# Patient Record
Sex: Female | Born: 1960 | Race: White | Hispanic: No | Marital: Married | Smoking: Never smoker
Health system: Southern US, Community
[De-identification: ages and names within clinical notes are randomized; demographics above are authoritative.]

## PROBLEM LIST (undated history)

## (undated) DIAGNOSIS — F419 Anxiety disorder, unspecified: Secondary | ICD-10-CM

## (undated) DIAGNOSIS — I82409 Acute embolism and thrombosis of unspecified deep veins of unspecified lower extremity: Secondary | ICD-10-CM

## (undated) HISTORY — DX: Anxiety disorder, unspecified: F41.9

## (undated) HISTORY — PX: VAGINAL HYSTERECTOMY: SUR661

## (undated) HISTORY — DX: Acute embolism and thrombosis of unspecified deep veins of unspecified lower extremity: I82.409

---

## 2005-04-11 ENCOUNTER — Inpatient Hospital Stay: Payer: Self-pay | Admitting: Obstetrics and Gynecology

## 2007-05-19 ENCOUNTER — Ambulatory Visit: Payer: Self-pay | Admitting: Obstetrics and Gynecology

## 2007-05-19 ENCOUNTER — Other Ambulatory Visit: Payer: Self-pay

## 2009-10-25 ENCOUNTER — Ambulatory Visit: Payer: Self-pay | Admitting: Chiropractic Medicine

## 2011-01-15 IMAGING — CR DG FOOT COMPLETE 3+V*L*
1 series · 3 of 3 positions shown · non-contrast
Comparison: none

REASON FOR EXAM: Pain along lateral aspect of left foot
COMMENTS:

PROCEDURE:     DXR - DXR FOOT LT COMP W/OBLIQUES  - October 25, 2009  [DATE]
RESULT:     Images of the left foot demonstrate no definite fracture,
dislocation or radiopaque foreign body. If an occult fracture is suspected
then repeat images would be recommended in 7 to 10 days.

[Series 1: view not recorded · 0.17mm/px · 3 of 3 slices shown]
[im 1/3]
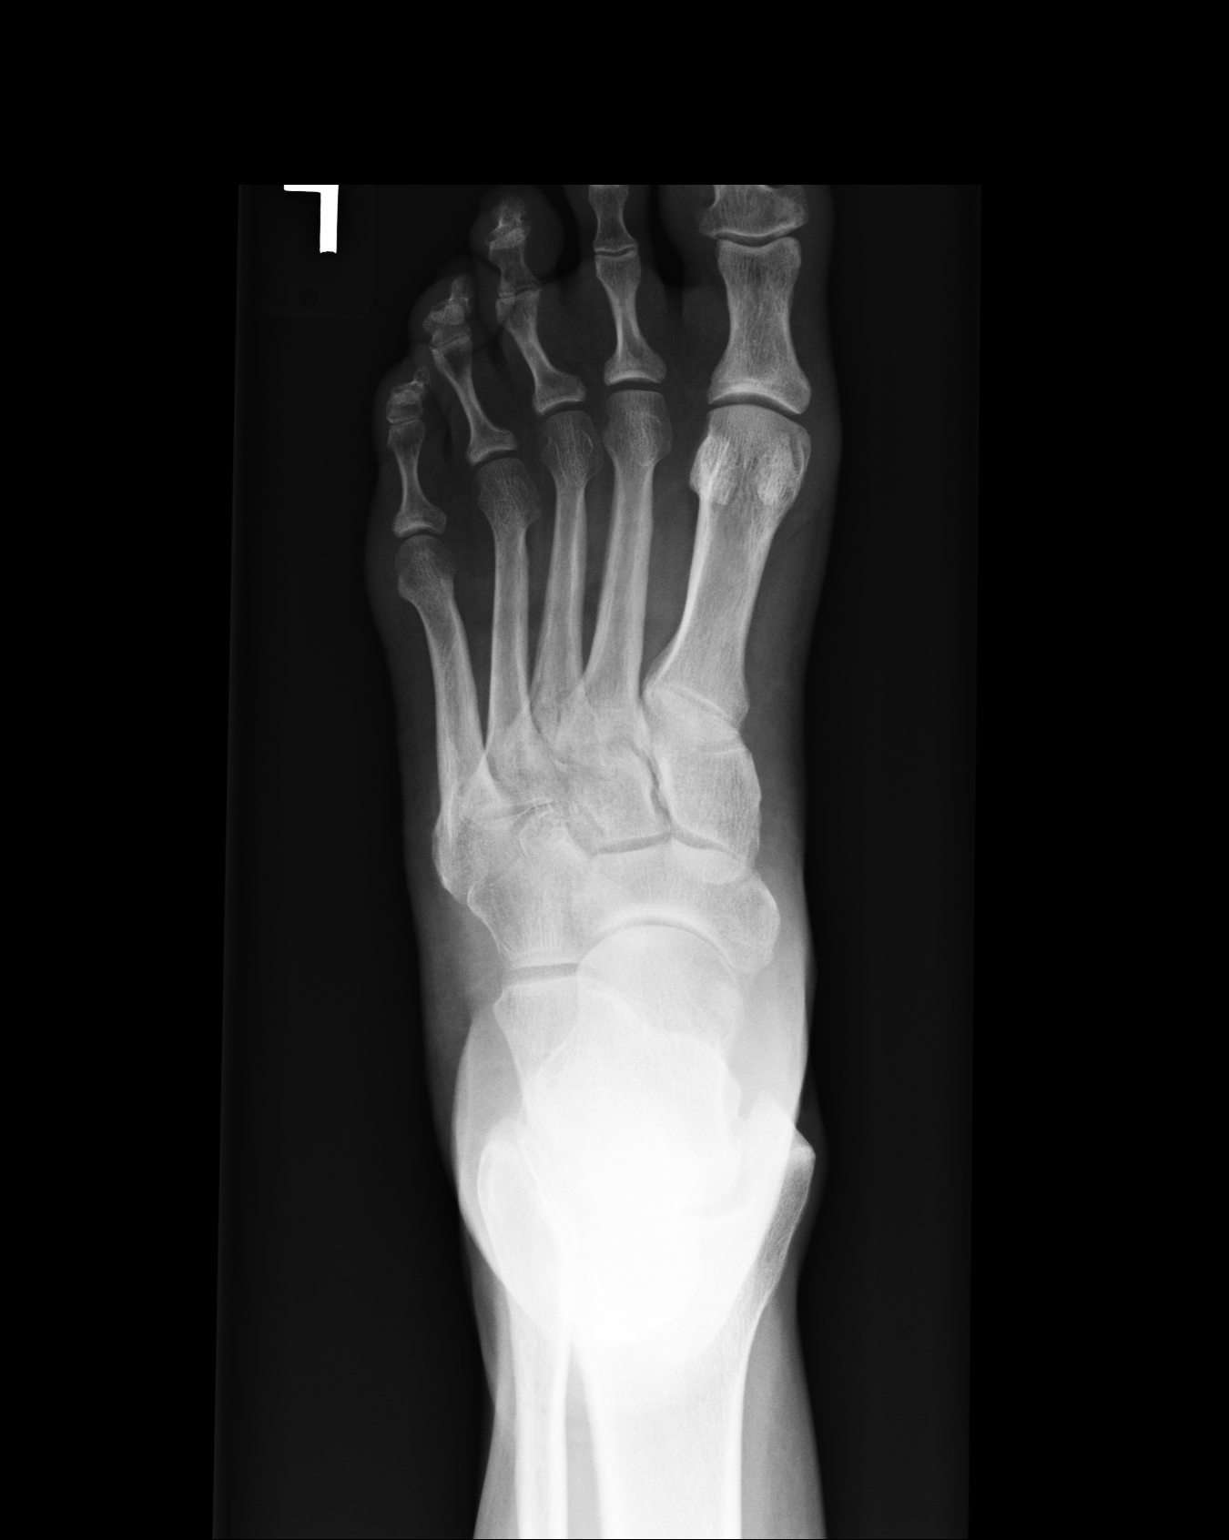
[im 2/3]
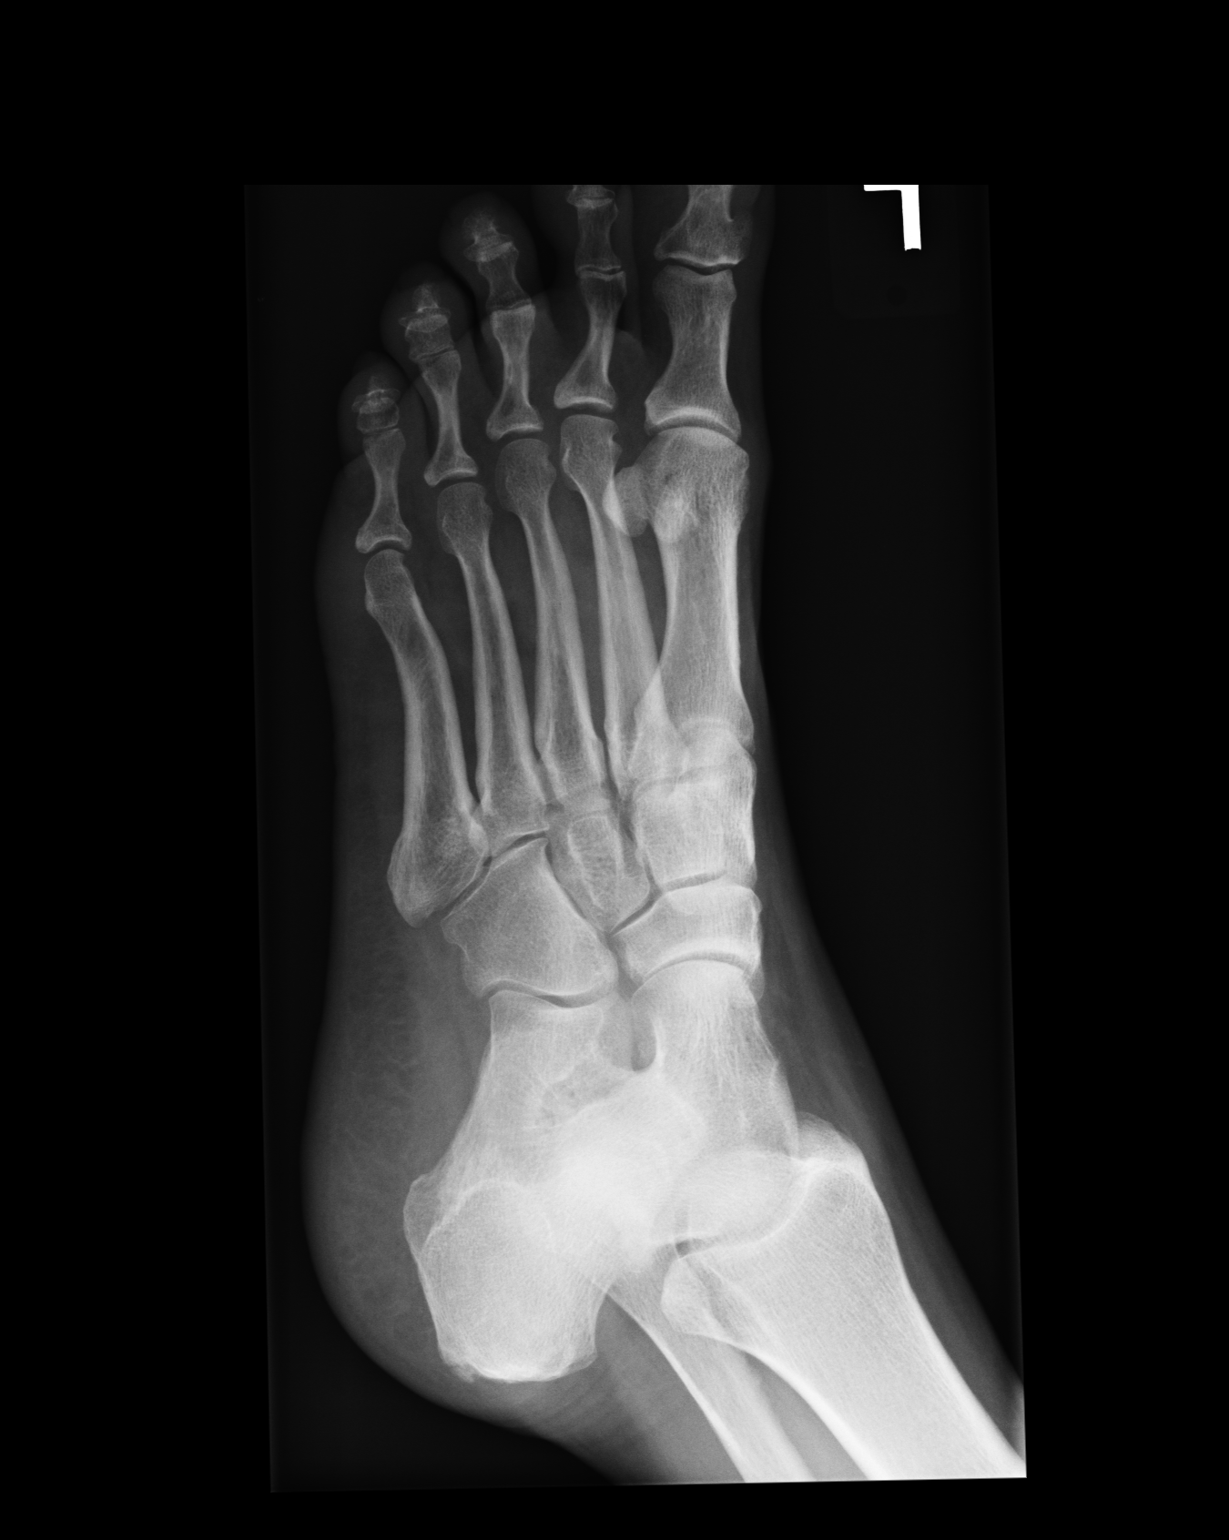
[im 3/3]
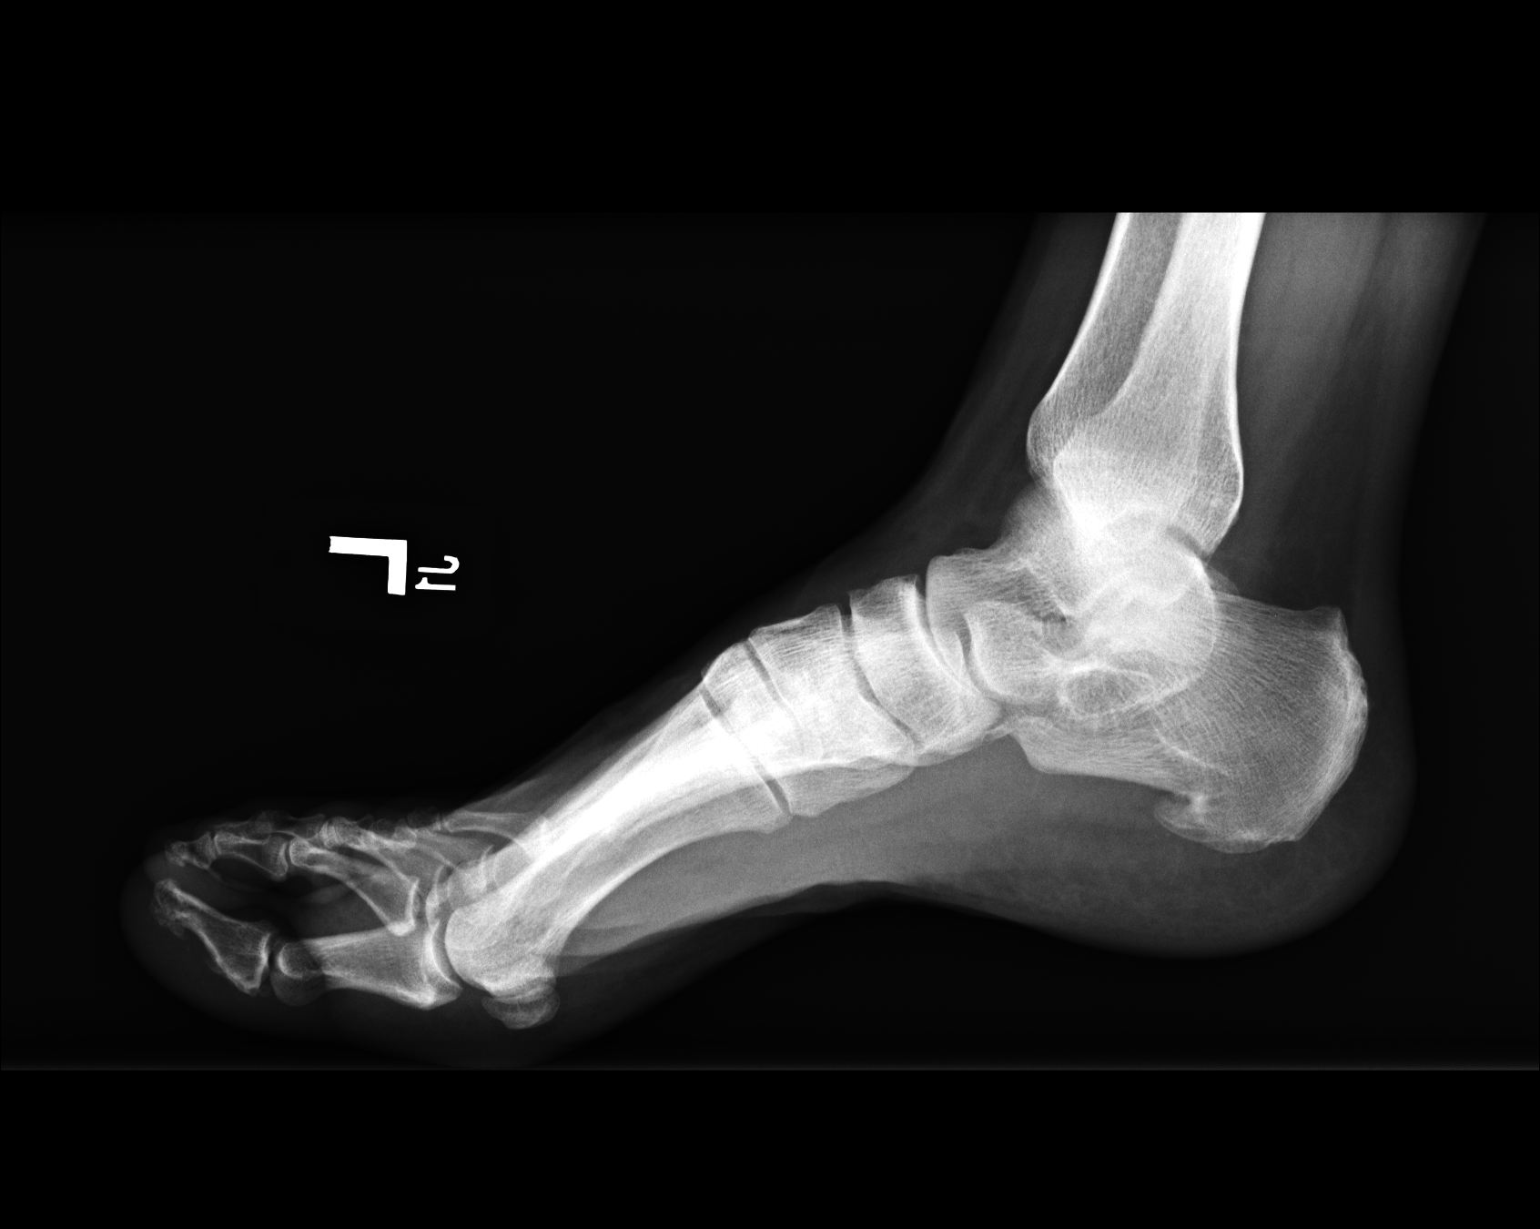

[3 of 3 positions shown; findings below may reference images not displayed]

IMPRESSION: Please see above.

## 2013-10-06 LAB — HM MAMMOGRAPHY: HM Mammogram: NORMAL

## 2015-02-27 LAB — TSH: TSH: 2.03 u[IU]/mL (ref <=5.90)

## 2015-02-27 LAB — HEPATIC FUNCTION PANEL
ALT: 27 U/L (ref 7–35)
AST: 25 U/L (ref 13–35)

## 2015-02-27 LAB — BASIC METABOLIC PANEL
BUN: 14 mg/dL (ref 4–21)
CREATININE: 0.8 mg/dL (ref <=1.1)
GLUCOSE: 93 mg/dL

## 2015-02-27 LAB — LIPID PANEL
Cholesterol: 163 mg/dL (ref 0–200)
HDL: 63 mg/dL (ref 35–70)
LDL Cholesterol: 87 mg/dL
Triglycerides: 64 mg/dL (ref 40–160)

## 2015-02-27 LAB — CBC AND DIFFERENTIAL: Hemoglobin: 13.9 g/dL (ref 12.0–16.0)

## 2015-02-28 DIAGNOSIS — F419 Anxiety disorder, unspecified: Secondary | ICD-10-CM | POA: Insufficient documentation

## 2015-02-28 DIAGNOSIS — K589 Irritable bowel syndrome without diarrhea: Secondary | ICD-10-CM | POA: Insufficient documentation

## 2015-02-28 DIAGNOSIS — Z0000 Encounter for general adult medical examination without abnormal findings: Secondary | ICD-10-CM | POA: Insufficient documentation

## 2015-11-19 ENCOUNTER — Ambulatory Visit (INDEPENDENT_AMBULATORY_CARE_PROVIDER_SITE_OTHER): Payer: BC Managed Care – PPO | Admitting: Family Medicine

## 2015-11-19 ENCOUNTER — Encounter: Payer: Self-pay | Admitting: Family Medicine

## 2015-11-19 VITALS — BP 112/80 | HR 72 | Ht 68.0 in | Wt 193.0 lb

## 2015-11-19 DIAGNOSIS — L259 Unspecified contact dermatitis, unspecified cause: Secondary | ICD-10-CM | POA: Diagnosis not present

## 2015-11-19 DIAGNOSIS — F410 Panic disorder [episodic paroxysmal anxiety] without agoraphobia: Secondary | ICD-10-CM | POA: Diagnosis not present

## 2015-11-19 MED ORDER — TRIAMCINOLONE ACETONIDE 0.1 % EX CREA: 1.0000 "application " | TOPICAL_CREAM | Freq: Two times a day (BID) | CUTANEOUS | Status: DC

## 2015-11-19 MED ORDER — ALPRAZOLAM 0.25 MG PO TABS: 0.2500 mg | ORAL_TABLET | Freq: Every evening | ORAL | Status: DC | PRN

## 2015-11-19 MED ORDER — IBUPROFEN 800 MG PO TABS: 800.0000 mg | ORAL_TABLET | Freq: Three times a day (TID) | ORAL | Status: DC

## 2015-11-19 NOTE — Progress Notes (Signed)
Name: Sylvia Boyer   MRN: [id number]    DOB: 1961-09-10   Date:11/19/2015       Progress Note  Subjective  Chief Complaint  Chief Complaint  Patient presents with  . Rash    warm to touch, red circular pattern- mainly on legs but some on top of L) arm  . Anxiety    wants med for flying    Rash This is a new problem. The current episode started yesterday. The problem has been gradually worsening since onset. The affected locations include the left upper leg and right upper leg. The rash is characterized by redness. Pertinent negatives include no anorexia, congestion, cough, diarrhea, facial edema, fatigue, fever, joint pain, shortness of breath or sore throat. Past treatments include nothing. The treatment provided mild relief.  Anxiety Patient reports no chest pain, dizziness, insomnia, nausea, nervous/anxious behavior, palpitations, shortness of breath or suicidal ideas.      No problem-specific assessment & plan notes found for this encounter.   Past Medical History  Diagnosis Date  . Anxiety     Past Surgical History  Procedure Laterality Date  . Vaginal hysterectomy      No family history on file.  Social History   Social History  . Marital Status: Married    Spouse Name: N/A  . Number of Children: N/A  . Years of Education: N/A   Occupational History  . Not on file.   Social History Main Topics  . Smoking status: Never Smoker   . Smokeless tobacco: Not on file  . Alcohol Use: 0.0 oz/week    0 Standard drinks or equivalent per week  . Drug Use: No  . Sexual Activity: Not on file   Other Topics Concern  . Not on file   Social History Narrative    Allergies  Allergen Reactions  . Erythromycin Nausea Only     Review of Systems  Constitutional: Negative for fever, chills, weight loss, malaise/fatigue and fatigue.  HENT: Negative for congestion, ear discharge, ear pain and sore throat.   Eyes: Negative for blurred vision.  Respiratory:  Negative for cough, sputum production, shortness of breath and wheezing.   Cardiovascular: Negative for chest pain, palpitations and leg swelling.  Gastrointestinal: Negative for heartburn, nausea, abdominal pain, diarrhea, constipation, blood in stool, melena and anorexia.  Genitourinary: Negative for dysuria, urgency, frequency and hematuria.  Musculoskeletal: Negative for myalgias, back pain, joint pain and neck pain.  Skin: Positive for rash. Negative for itching.  Neurological: Negative for dizziness, tingling, sensory change, focal weakness and headaches.  Endo/Heme/Allergies: Negative for environmental allergies and polydipsia. Does not bruise/bleed easily.  Psychiatric/Behavioral: Negative for depression and suicidal ideas. The patient is not nervous/anxious and does not have insomnia.      Objective  Filed Vitals:   11/19/15 1529  BP: 112/80  Pulse: 72  Height:  (1.727 m)  Weight: 193 lb (87.544 kg)    Physical Exam  Constitutional: She is well-developed, well-nourished, and in no distress. No distress.  HENT:  Head: Normocephalic and atraumatic.  Right Ear: External ear normal.  Left Ear: External ear normal.  Nose: Nose normal.  Mouth/Throat: Oropharynx is clear and moist.  Eyes: Conjunctivae and EOM are normal. Pupils are equal, round, and reactive to light. Right eye exhibits no discharge. Left eye exhibits no discharge.  Neck: Normal range of motion. Neck supple. No JVD present. No thyromegaly present.  Cardiovascular: Normal rate, regular rhythm, normal heart sounds and intact distal pulses.  Exam reveals no gallop and no friction rub.   No murmur heard. Pulmonary/Chest: Effort normal and breath sounds normal.  Abdominal: Soft. Bowel sounds are normal. She exhibits no mass. There is no tenderness. There is no guarding.  Musculoskeletal: Normal range of motion. She exhibits no edema.  Lymphadenopathy:    She has no cervical adenopathy.  Neurological: She is  alert.  Skin: Skin is warm and dry. Rash noted. Rash is macular. She is not diaphoretic. There is erythema.  Psychiatric: Mood and affect normal.  Nursing note and vitals reviewed.     Assessment & Plan  Problem List Items Addressed This Visit    None    Visit Diagnoses    Contact dermatitis    -  Primary    Relevant Medications    triamcinolone cream (KENALOG) 0.1 %    Episodic paroxysmal anxiety disorder        Relevant Medications    ALPRAZolam (XANAX) 0.25 MG tablet         Dr. Hayden [facility]  11/19/2015

## 2016-01-21 ENCOUNTER — Ambulatory Visit (INDEPENDENT_AMBULATORY_CARE_PROVIDER_SITE_OTHER): Payer: BC Managed Care – PPO | Admitting: Family Medicine

## 2016-01-21 ENCOUNTER — Encounter: Payer: Self-pay | Admitting: Family Medicine

## 2016-01-21 VITALS — BP 120/80 | HR 80 | Ht 68.0 in | Wt 190.0 lb

## 2016-01-21 DIAGNOSIS — S30861A Insect bite (nonvenomous) of abdominal wall, initial encounter: Secondary | ICD-10-CM

## 2016-01-21 DIAGNOSIS — W57XXXA Bitten or stung by nonvenomous insect and other nonvenomous arthropods, initial encounter: Secondary | ICD-10-CM | POA: Diagnosis not present

## 2016-01-21 MED ORDER — DOXYCYCLINE HYCLATE 100 MG PO TABS: 100.0000 mg | ORAL_TABLET | Freq: Two times a day (BID) | ORAL | Status: DC

## 2016-01-21 NOTE — Progress Notes (Signed)
Name: Sylvia Boyer   MRN: [id number]    DOB: [birth year]   Date:01/21/2016       Progress Note  Subjective  Chief Complaint  Chief Complaint  Patient presents with  . Insect Bite    pulled tick off belly x 2 days ago- itching and red    Rash This is a new problem. The current episode started in the past 7 days. The affected locations include the abdomen. The rash is characterized by itchiness and redness. She was exposed to an insect bite/sting (deer tick). Pertinent negatives include no anorexia, congestion, cough, diarrhea, eye pain, facial edema, fatigue, fever, joint pain, nail changes, rhinorrhea, shortness of breath, sore throat or vomiting. Past treatments include topical steroids. The treatment provided mild relief. Her past medical history is significant for allergies.    No problem-specific assessment & plan notes found for this encounter.   Past Medical History  Diagnosis Date  . Anxiety     Past Surgical History  Procedure Laterality Date  . Vaginal hysterectomy      History reviewed. No pertinent family history.  Social History   Social History  . Marital Status: Married    Spouse Name: N/A  . Number of Children: N/A  . Years of Education: N/A   Occupational History  . Not on file.   Social History Main Topics  . Smoking status: Never Smoker   . Smokeless tobacco: Not on file  . Alcohol Use: 0.0 oz/week    0 Standard drinks or equivalent per week  . Drug Use: No  . Sexual Activity: Not on file   Other Topics Concern  . Not on file   Social History Narrative    Allergies  Allergen Reactions  . Erythromycin Nausea Only     Review of Systems  Constitutional: Negative for fever, chills, weight loss, malaise/fatigue and fatigue.  HENT: Negative for congestion, ear discharge, ear pain, rhinorrhea and sore throat.   Eyes: Negative for blurred vision and pain.  Respiratory: Negative for cough, sputum production, shortness of breath and  wheezing.   Cardiovascular: Negative for chest pain, palpitations and leg swelling.  Gastrointestinal: Negative for heartburn, nausea, vomiting, abdominal pain, diarrhea, constipation, blood in stool, melena and anorexia.  Genitourinary: Negative for dysuria, urgency, frequency and hematuria.  Musculoskeletal: Negative for myalgias, back pain, joint pain and neck pain.  Skin: Positive for rash. Negative for nail changes.  Neurological: Negative for dizziness, tingling, sensory change, focal weakness and headaches.  Endo/Heme/Allergies: Negative for environmental allergies and polydipsia. Does not bruise/bleed easily.  Psychiatric/Behavioral: Negative for depression and suicidal ideas. The patient is not nervous/anxious and does not have insomnia.      Objective  Filed Vitals:   01/21/16 1619  BP: 120/80  Pulse: 80  Height:  (1.727 m)  Weight: 190 lb (86.183 kg)    Physical Exam  Constitutional: She is well-developed, well-nourished, and in no distress. No distress.  HENT:  Head: Normocephalic and atraumatic.  Right Ear: External ear normal.  Left Ear: External ear normal.  Nose: Nose normal.  Mouth/Throat: Oropharynx is clear and moist.  Eyes: Conjunctivae and EOM are normal. Pupils are equal, round, and reactive to light. Right eye exhibits no discharge. Left eye exhibits no discharge.  Neck: Normal range of motion. Neck supple. No JVD present. No thyromegaly present.  Cardiovascular: Normal rate, regular rhythm, normal heart sounds and intact distal pulses.  Exam reveals no gallop and no friction rub.   No murmur heard.  Pulmonary/Chest: Effort normal and breath sounds normal.  Abdominal: Soft. Bowel sounds are normal. She exhibits no mass. There is no tenderness. There is no guarding.  Musculoskeletal: Normal range of motion. She exhibits no edema.  Lymphadenopathy:    She has no cervical adenopathy.  Neurological: She is alert.  Skin: Skin is warm and dry. She is not  diaphoretic. There is erythema.  Psychiatric: Mood and affect normal.  Nursing note and vitals reviewed.     Assessment & Plan  Problem List Items Addressed This Visit    None        Dr. Elizabeth [facility]uereanna Jones [facility]  01/21/2016

## 2016-02-04 ENCOUNTER — Ambulatory Visit (INDEPENDENT_AMBULATORY_CARE_PROVIDER_SITE_OTHER): Payer: BC Managed Care – PPO | Admitting: Family Medicine

## 2016-02-04 ENCOUNTER — Encounter: Payer: Self-pay | Admitting: Family Medicine

## 2016-02-04 VITALS — BP 128/80 | HR 72 | Ht 68.0 in | Wt 192.0 lb

## 2016-02-04 DIAGNOSIS — R69 Illness, unspecified: Secondary | ICD-10-CM | POA: Diagnosis not present

## 2016-02-04 DIAGNOSIS — W57XXXA Bitten or stung by nonvenomous insect and other nonvenomous arthropods, initial encounter: Secondary | ICD-10-CM

## 2016-02-04 DIAGNOSIS — T148 Other injury of unspecified body region: Secondary | ICD-10-CM

## 2016-02-04 NOTE — Progress Notes (Signed)
Name: Sylvia Boyer   MRN: [id number]    DOB: [birth year]   Date:02/04/2016       Progress Note  Subjective  Chief Complaint  Chief Complaint  Patient presents with  . Insect Bite    on R) leg  . Labs Only    renal, lipid, hepatic    Rash This is a new problem. The current episode started 1 to 4 weeks ago. The problem has been waxing and waning since onset. The affected locations include the right upper leg. The rash is characterized by itchiness, redness and swelling. She was exposed to an insect bite/sting. Pertinent negatives include no cough, diarrhea, fever, joint pain, shortness of breath or sore throat. Past treatments include nothing. The treatment provided mild relief.    No problem-specific assessment & plan notes found for this encounter.   Past Medical History  Diagnosis Date  . Anxiety     Past Surgical History  Procedure Laterality Date  . Vaginal hysterectomy      History reviewed. No pertinent family history.  Social History   Social History  . Marital Status: Married    Spouse Name: N/A  . Number of Children: N/A  . Years of Education: N/A   Occupational History  . Not on file.   Social History Main Topics  . Smoking status: Never Smoker   . Smokeless tobacco: Not on file  . Alcohol Use: 0.0 oz/week    0 Standard drinks or equivalent per week  . Drug Use: No  . Sexual Activity: Not on file   Other Topics Concern  . Not on file   Social History Narrative    Allergies  Allergen Reactions  . Erythromycin Nausea Only     Review of Systems  Constitutional: Negative for fever, chills, weight loss and malaise/fatigue.  HENT: Negative for ear discharge, ear pain and sore throat.   Eyes: Negative for blurred vision.  Respiratory: Negative for cough, sputum production, shortness of breath and wheezing.   Cardiovascular: Negative for chest pain, palpitations and leg swelling.  Gastrointestinal: Negative for heartburn, nausea, abdominal  pain, diarrhea, constipation, blood in stool and melena.  Genitourinary: Negative for dysuria, urgency, frequency and hematuria.  Musculoskeletal: Negative for myalgias, back pain, joint pain and neck pain.  Skin: Positive for rash.  Neurological: Negative for dizziness, tingling, sensory change, focal weakness and headaches.  Endo/Heme/Allergies: Negative for environmental allergies and polydipsia. Does not bruise/bleed easily.  Psychiatric/Behavioral: Negative for depression and suicidal ideas. The patient is not nervous/anxious and does not have insomnia.      Objective  Filed Vitals:   02/04/16 0836  BP: 128/80  Pulse: 72  Height: 5\' 8"  (1.727 m)  Weight: 192 lb (87.091 kg)    Physical Exam  Constitutional: She is well-developed, well-nourished, and in no distress. No distress.  HENT:  Head: Normocephalic and atraumatic.  Right Ear: External ear normal.  Left Ear: External ear normal.  Nose: Nose normal.  Mouth/Throat: Oropharynx is clear and moist.  Eyes: Conjunctivae and EOM are normal. Pupils are equal, round, and reactive to light. Right eye exhibits no discharge. Left eye exhibits no discharge.  Neck: Normal range of motion. Neck supple. No JVD present. No thyromegaly present.  Cardiovascular: Normal rate, regular rhythm, normal heart sounds and intact distal pulses.  Exam reveals no gallop and no friction rub.   No murmur heard. Pulmonary/Chest: Effort normal and breath sounds normal.  Abdominal: Soft. Bowel sounds are normal. She exhibits no mass. There  is no tenderness. There is no guarding.  Musculoskeletal: Normal range of motion. She exhibits no edema.  Lymphadenopathy:    She has no cervical adenopathy.  Neurological: She is alert.  Skin: Skin is warm and dry. She is not diaphoretic. There is erythema.  Insect bite  Psychiatric: Mood and affect normal.  Nursing note and vitals reviewed.     Assessment & Plan  Problem List Items Addressed This Visit     None    Visit Diagnoses    Insect bite    -  Primary    use triamcinolone cream    Taking medication for chronic disease        rtc for labs / renal/lipid/ hepatic         Dr. Elizabeth Sauer [facility]  02/04/2016

## 2016-05-22 ENCOUNTER — Other Ambulatory Visit: Payer: BC Managed Care – PPO

## 2016-06-04 ENCOUNTER — Telehealth: Payer: Self-pay | Admitting: Gastroenterology

## 2016-06-04 NOTE — Telephone Encounter (Signed)
colonoscopy

## 2016-06-05 NOTE — Telephone Encounter (Signed)
Called patient and LVM.

## 2016-06-12 NOTE — Telephone Encounter (Signed)
Called patient and LVM. Notification letter sent.

## 2016-06-13 ENCOUNTER — Telehealth: Payer: Self-pay | Admitting: Gastroenterology

## 2016-06-13 NOTE — Telephone Encounter (Signed)
colonoscopy

## 2016-06-18 NOTE — Telephone Encounter (Signed)
Called patient and left a voicemail. I had previously called the patient twice before and sent out a notification letter

## 2016-06-23 ENCOUNTER — Telehealth: Payer: Self-pay | Admitting: Gastroenterology

## 2016-06-23 NOTE — Telephone Encounter (Signed)
Patient is returning your call regarding colonoscopy °

## 2016-06-24 NOTE — Telephone Encounter (Signed)
Called patient and left a voicemail 

## 2016-06-25 NOTE — Telephone Encounter (Signed)
Patient called around 4:20 yesterday. I could not do her colonoscopy because I was working on something in that moment. I told her I could not do her colonoscopy at this time. She laughed and and said "What do you mean you can't do my colonoscopy at this time"? I explained to her that doing her colonoscopy at this time will push me past 5:00 (with everything else that I had to complete at that moment). I asked her what time would be best for me to contact her tomorrow and she brushed it off. She said she will call me back sometime this week. I called this patient three times before and sent out a notification letter. She returned the phone call for a colonoscopy and I called her twice afterwards to try to get in contact with her. I will continue to call her and wait for her call to schedule her for a colonoscopy.

## 2016-06-27 NOTE — Telephone Encounter (Signed)
Called patient and left a voicemail. I will send the patient another notification letter

## 2017-01-23 NOTE — H&P (Signed)
 PRE-PROCEDURE HISTORY AND PHYSICAL EXAM  Sylvia Boyer presents for her scheduled COLONOSCOPY FLEX; W/REMOV TUMOR/LES BY SNARE COLONOSCOPY, FLEXIBLE, PROXIMAL TO SPLENIC FLEXURE; WITH BIOPSY, SINGLE OR MULTIPLE.   The indication for the procedure(s) is SCREENING FOR COLON CA.   There have been no significant recent changes in the patient's medical status.  No past medical history on file. Past Surgical History:  Procedure Laterality Date  . HYSTERECTOMY  2006  . OOPHORECTOMY  2006    Allergies Allergies  Allergen Reactions  . Erythromycin Nausea Only    Medications ALPRAZolam , St. John's wort, calcium carbonate, dicyclomine, glucosamine HCl, ibuprofen , and ondansetron   Physical Examination Vitals:   01/23/17 1223  BP: 119/79  Pulse: 71  Resp: 18  Temp:   SpO2: 96%   Body mass index is 29.65 kg/m.  Mental Status: AAOx3, thoughts organized   Lungs: Clear to auscultation, unlabored breathing   Heart: Regular rate and rhythm, normal S1 and S2, no murmur   Abdomen: Soft, non-tender, non-distended     ASSESSMENT AND PLAN [name] has been evaluated and deemed appropriate to undergo the planned COLONOSCOPY FLEX; W/REMOV TUMOR/LES BY SNARE COLONOSCOPY, FLEXIBLE, PROXIMAL TO SPLENIC FLEXURE; WITH BIOPSY, SINGLE OR MULTIPLE.  The patient, or her authorized representative, was provided a printed handout that explained the nature and benefits of the procedure(s), the most frequent risks, and alternatives, if any.  I personally reviewed this information with the patient, or her authorized representative, and answered all questions.  PRE-ANESTHESIA ASSESSMENT  Prior to the procedure, the risks and benefits of the procedure and the sedation options and sedation-related risks were discussed, all questions were answered, and written informed consent was obtained from the patient.  Recent Anticoagulant or Antiplatelet Use The patient has taken no previous anticoagulant  or antiplatelet agents..  Prophylactic Antibiotics The patient does not require prophylactic antibiotics.  ASA Grade Assessment ASA 1 - Normal health patient   Mallampati Classification II (hard and soft palate, upper portion of tonsils anduvula visible)  Anesthesia Plan Conscious sedation  The patient's prior sedation history was reviewed and there is no known history of sedation-related complications.  The anesthesia plan was reviewed with the patient and deemed appropriate for the patient.    Prior to the initiation of sedation and the start of the procedure, a time-out will be performed and the patient's identification and the proposed procedure will be verified.  Immediately prior to administration of medications, the patient will be re-assessed for adequacy to receive sedatives.   The heart rate, respiratory rate, oxygen saturations, blood pressure, adequacy of pulmonary ventilation, and response to care will be monitored throughout the procedure. The physical status of the patient will be re-assessed after the procedure.

## 2017-01-27 DIAGNOSIS — D122 Benign neoplasm of ascending colon: Secondary | ICD-10-CM | POA: Insufficient documentation

## 2017-08-12 ENCOUNTER — Encounter: Payer: Self-pay | Admitting: Family Medicine

## 2017-08-12 ENCOUNTER — Ambulatory Visit (INDEPENDENT_AMBULATORY_CARE_PROVIDER_SITE_OTHER): Payer: Managed Care, Other (non HMO) | Admitting: Family Medicine

## 2017-08-12 VITALS — BP 110/80 | HR 80 | Temp 98.3°F | Ht 68.0 in | Wt 196.0 lb

## 2017-08-12 DIAGNOSIS — J029 Acute pharyngitis, unspecified: Secondary | ICD-10-CM

## 2017-08-12 MED ORDER — AMOXICILLIN 500 MG PO CAPS: 500.0000 mg | ORAL_CAPSULE | Freq: Three times a day (TID) | ORAL | 0 refills | Status: DC

## 2017-08-12 NOTE — Progress Notes (Signed)
Name: Sylvia HammanDenise M Demars   MRN: [id number]    DOB: 09-09-1961   Date:08/12/2017       Progress Note  Subjective  Chief Complaint  Chief Complaint  Patient presents with  . Sore Throat    came on suddenly last night- feels like a fireball.    Sore Throat   This is a new ("tennis ball in my throat") problem. The current episode started yesterday. The problem has been gradually worsening. Neither side of throat is experiencing more pain than the other. There has been no fever. The pain is at a severity of 5/10. The pain is moderate. Associated symptoms include swollen glands. Pertinent negatives include no abdominal pain, congestion, coughing, diarrhea, ear discharge, ear pain, headaches, hoarse voice, neck pain or shortness of breath. She has tried NSAIDs for the symptoms. The treatment provided mild relief.    No problem-specific Assessment & Plan notes found for this encounter.   Past Medical History:  Diagnosis Date  . Anxiety     Past Surgical History:  Procedure Laterality Date  . VAGINAL HYSTERECTOMY      History reviewed. No pertinent family history.  Social History   Socioeconomic History  . Marital status: Married    Spouse name: Not on file  . Number of children: Not on file  . Years of education: Not on file  . Highest education level: Not on file  Social Needs  . Financial resource strain: Not on file  . Food insecurity - worry: Not on file  . Food insecurity - inability: Not on file  . Transportation needs - medical: Not on file  . Transportation needs - non-medical: Not on file  Occupational History  . Not on file  Tobacco Use  . Smoking status: Never Smoker  . Smokeless tobacco: Never Used  Substance and Sexual Activity  . Alcohol use: Yes    Alcohol/week: 0.0 oz  . Drug use: No  . Sexual activity: Not Currently  Other Topics Concern  . Not on file  Social History Narrative  . Not on file    Allergies  Allergen Reactions  . Erythromycin  Nausea Only    Outpatient Medications Prior to Visit  Medication Sig Dispense Refill  . ALPRAZolam (XANAX) 0.25 MG tablet Take 1 tablet (0.25 mg total) by mouth at bedtime as needed for anxiety. 20 tablet 0  . ibuprofen (ADVIL,MOTRIN) 800 MG tablet Take 1 tablet (800 mg total) by mouth 3 (three) times daily. 30 tablet 11  . dicyclomine (BENTYL) 10 MG capsule Take 1 capsule by mouth 2 (two) times daily. Reported on 01/21/2016    . triamcinolone cream (KENALOG) 0.1 % Apply 1 application topically 2 (two) times daily. 30 g 0   No facility-administered medications prior to visit.     Review of Systems  Constitutional: Negative for chills, fever, malaise/fatigue and weight loss.  HENT: Negative for congestion, ear discharge, ear pain, hoarse voice and sore throat.   Eyes: Negative for blurred vision.  Respiratory: Negative for cough, sputum production, shortness of breath and wheezing.   Cardiovascular: Negative for chest pain, palpitations and leg swelling.  Gastrointestinal: Negative for abdominal pain, blood in stool, constipation, diarrhea, heartburn, melena and nausea.  Genitourinary: Negative for dysuria, frequency, hematuria and urgency.  Musculoskeletal: Negative for back pain, joint pain, myalgias and neck pain.  Skin: Negative for rash.  Neurological: Negative for dizziness, tingling, sensory change, focal weakness and headaches.  Endo/Heme/Allergies: Negative for environmental allergies and polydipsia. Does not bruise/bleed  easily.  Psychiatric/Behavioral: Negative for depression and suicidal ideas. The patient is not nervous/anxious and does not have insomnia.      Objective  Vitals:   08/12/17 1149  BP: 110/80  Pulse: 80  Temp: 98.3 F (36.8 C)  Weight: 196 lb (88.9 kg)  Height: 5\' 8"  (1.727 m)    Physical Exam  Constitutional: She is well-developed, well-nourished, and in no distress. No distress.  HENT:  Head: Normocephalic and atraumatic.  Right Ear: Tympanic  membrane and external ear normal.  Left Ear: External ear normal. A middle ear effusion is present.  Nose: Mucosal edema present.  Mouth/Throat: Posterior oropharyngeal erythema present. No oropharyngeal exudate or posterior oropharyngeal edema.  Eyes: Conjunctivae and EOM are normal. Pupils are equal, round, and reactive to light. Right eye exhibits no discharge. Left eye exhibits no discharge.  Neck: Normal range of motion. Neck supple. No JVD present. No thyromegaly present.  Cardiovascular: Normal rate, regular rhythm, normal heart sounds and intact distal pulses. Exam reveals no gallop and no friction rub.  No murmur heard. Pulmonary/Chest: Effort normal and breath sounds normal. She has no wheezes. She has no rales.  Abdominal: Soft. Bowel sounds are normal. She exhibits no mass. There is no tenderness. There is no guarding.  Musculoskeletal: Normal range of motion. She exhibits no edema.  Lymphadenopathy:    She has no cervical adenopathy.  Neurological: She is alert. She has normal reflexes.  Skin: Skin is warm and dry. She is not diaphoretic. No erythema.  Psychiatric: Mood and affect normal.  Nursing note and vitals reviewed.     Assessment & Plan  Problem List Items Addressed This Visit    None    Visit Diagnoses    Pharyngitis, unspecified etiology    -  Primary   Relevant Medications   amoxicillin (AMOXIL) 500 MG capsule      Meds ordered this encounter  Medications  . amoxicillin (AMOXIL) 500 MG capsule    Sig: Take 1 capsule (500 mg total) 3 (three) times daily by mouth.    Dispense:  30 capsule    Refill:  0   Sample of nasacort given.    Dr. Hayden Rasmusseneanna Jones [facility]  08/12/17

## 2017-12-07 ENCOUNTER — Ambulatory Visit (INDEPENDENT_AMBULATORY_CARE_PROVIDER_SITE_OTHER): Payer: Managed Care, Other (non HMO) | Admitting: Family Medicine

## 2017-12-07 ENCOUNTER — Encounter: Payer: Self-pay | Admitting: Family Medicine

## 2017-12-07 VITALS — BP 120/80 | HR 84 | Ht 68.0 in | Wt 196.0 lb

## 2017-12-07 DIAGNOSIS — B029 Zoster without complications: Secondary | ICD-10-CM | POA: Diagnosis not present

## 2017-12-07 DIAGNOSIS — W57XXXA Bitten or stung by nonvenomous insect and other nonvenomous arthropods, initial encounter: Secondary | ICD-10-CM

## 2017-12-07 DIAGNOSIS — L303 Infective dermatitis: Secondary | ICD-10-CM

## 2017-12-07 MED ORDER — DOXYCYCLINE HYCLATE 100 MG PO TABS: 100.0000 mg | ORAL_TABLET | Freq: Two times a day (BID) | ORAL | 0 refills | Status: DC

## 2017-12-07 MED ORDER — MUPIROCIN 2 % EX OINT: 1.0000 "application " | TOPICAL_OINTMENT | Freq: Two times a day (BID) | CUTANEOUS | 0 refills | Status: DC

## 2017-12-07 NOTE — Progress Notes (Signed)
Name: Sylvia Boyer   MRN: [id number]    DOB: Mar 12, 1961   Date:12/07/2017       Progress Note  Subjective  Chief Complaint  Chief Complaint  Patient presents with  . Insect Bite    pulled tick off back- itching and has a whelp    Removed deer tick about 2 weeks ago   Rash  This is a new problem. The current episode started 1 to 4 weeks ago (2 weeks). The problem has been gradually worsening since onset. The affected locations include the back. The rash is characterized by redness and itchiness. She was exposed to nothing. Pertinent negatives include no anorexia, congestion, cough, diarrhea, eye pain, facial edema, fatigue, fever, joint pain, nail changes, rhinorrhea, shortness of breath or sore throat. The treatment provided no relief.    No problem-specific Assessment & Plan notes found for this encounter.   Past Medical History:  Diagnosis Date  . Anxiety     Past Surgical History:  Procedure Laterality Date  . VAGINAL HYSTERECTOMY      No family history on file.  Social History   Socioeconomic History  . Marital status: Married    Spouse name: Not on file  . Number of children: Not on file  . Years of education: Not on file  . Highest education level: Not on file  Social Needs  . Financial resource strain: Not on file  . Food insecurity - worry: Not on file  . Food insecurity - inability: Not on file  . Transportation needs - medical: Not on file  . Transportation needs - non-medical: Not on file  Occupational History  . Not on file  Tobacco Use  . Smoking status: Never Smoker  . Smokeless tobacco: Never Used  Substance and Sexual Activity  . Alcohol use: Yes    Alcohol/week: 0.0 oz  . Drug use: No  . Sexual activity: Not Currently  Other Topics Concern  . Not on file  Social History Narrative  . Not on file    Allergies  Allergen Reactions  . Erythromycin Nausea Only    Outpatient Medications Prior to Visit  Medication Sig Dispense Refill   . ALPRAZolam (XANAX) 0.25 MG tablet Take 1 tablet (0.25 mg total) by mouth at bedtime as needed for anxiety. 20 tablet 0  . ibuprofen (ADVIL,MOTRIN) 800 MG tablet Take 1 tablet (800 mg total) by mouth 3 (three) times daily. 30 tablet 11  . dicyclomine (BENTYL) 10 MG capsule Take 1 capsule by mouth 2 (two) times daily. Reported on 01/21/2016    . amoxicillin (AMOXIL) 500 MG capsule Take 1 capsule (500 mg total) 3 (three) times daily by mouth. 30 capsule 0   No facility-administered medications prior to visit.     Review of Systems  Constitutional: Negative for chills, fatigue, fever, malaise/fatigue and weight loss.  HENT: Negative for congestion, ear discharge, ear pain, rhinorrhea and sore throat.   Eyes: Negative for blurred vision and pain.  Respiratory: Negative for cough, sputum production, shortness of breath and wheezing.   Cardiovascular: Negative for chest pain, palpitations and leg swelling.  Gastrointestinal: Negative for abdominal pain, anorexia, blood in stool, constipation, diarrhea, heartburn, melena and nausea.  Genitourinary: Negative for dysuria, frequency, hematuria and urgency.  Musculoskeletal: Negative for back pain, joint pain, myalgias and neck pain.  Skin: Positive for rash. Negative for nail changes.  Neurological: Negative for dizziness, tingling, sensory change, focal weakness and headaches.  Endo/Heme/Allergies: Negative for environmental allergies and polydipsia. Does  not bruise/bleed easily.  Psychiatric/Behavioral: Negative for depression and suicidal ideas. The patient is not nervous/anxious and does not have insomnia.      Objective  Vitals:   12/07/17 1120  BP: 120/80  Pulse: 84  Weight: 196 lb (88.9 kg)  Height: 5\' 8"  (1.727 m)    Physical Exam  Constitutional: She is well-developed, well-nourished, and in no distress. No distress.  HENT:  Head: Normocephalic and atraumatic.  Right Ear: External ear normal.  Left Ear: External ear normal.   Nose: Nose normal.  Mouth/Throat: Oropharynx is clear and moist.  Eyes: Conjunctivae and EOM are normal. Pupils are equal, round, and reactive to light. Right eye exhibits no discharge. Left eye exhibits no discharge.  Neck: Normal range of motion. Neck supple. No JVD present. No thyromegaly present.  Cardiovascular: Normal rate, regular rhythm, normal heart sounds and intact distal pulses. Exam reveals no gallop and no friction rub.  No murmur heard. Pulmonary/Chest: Effort normal and breath sounds normal. She has no wheezes. She has no rales.  Abdominal: Soft. Bowel sounds are normal. She exhibits no mass. There is no tenderness. There is no guarding.  Musculoskeletal: Normal range of motion. She exhibits no edema.  Lymphadenopathy:    She has no cervical adenopathy.  Neurological: She is alert. She has normal reflexes.  Skin: Skin is warm and dry. She is not diaphoretic.  Pustular/erythematous  Psychiatric: Mood and affect normal.  Nursing note and vitals reviewed.     Assessment & Plan  Problem List Items Addressed This Visit    None    Visit Diagnoses    Infective dermatitis    -  Primary   Relevant Medications   doxycycline (VIBRA-TABS) 100 MG tablet   mupirocin ointment (BACTROBAN) 2 %   Tick bite, initial encounter       Relevant Medications   doxycycline (VIBRA-TABS) 100 MG tablet   Herpes zoster without complication       2 week from initial appearance   Relevant Medications   mupirocin ointment (BACTROBAN) 2 %      Meds ordered this encounter  Medications  . doxycycline (VIBRA-TABS) 100 MG tablet    Sig: Take 1 tablet (100 mg total) by mouth 2 (two) times daily.    Dispense:  20 tablet    Refill:  0  . mupirocin ointment (BACTROBAN) 2 %    Sig: Apply 1 application topically 2 (two) times daily.    Dispense:  22 g    Refill:  0      Dr. Hayden [facility]  12/07/17

## 2018-05-04 ENCOUNTER — Ambulatory Visit (INDEPENDENT_AMBULATORY_CARE_PROVIDER_SITE_OTHER): Payer: Managed Care, Other (non HMO) | Admitting: Family Medicine

## 2018-05-04 ENCOUNTER — Encounter: Payer: Self-pay | Admitting: Family Medicine

## 2018-05-04 VITALS — BP 120/70 | HR 96 | Ht 68.0 in | Wt 202.0 lb

## 2018-05-04 DIAGNOSIS — B351 Tinea unguium: Secondary | ICD-10-CM | POA: Diagnosis not present

## 2018-05-04 DIAGNOSIS — Z23 Encounter for immunization: Secondary | ICD-10-CM | POA: Diagnosis not present

## 2018-05-04 DIAGNOSIS — F410 Panic disorder [episodic paroxysmal anxiety] without agoraphobia: Secondary | ICD-10-CM | POA: Insufficient documentation

## 2018-05-04 MED ORDER — CICLOPIROX 8 % EX SOLN: Freq: Every day | CUTANEOUS | 0 refills | Status: DC

## 2018-05-04 MED ORDER — ALPRAZOLAM 0.25 MG PO TABS: 0.2500 mg | ORAL_TABLET | Freq: Every evening | ORAL | 0 refills | Status: AC | PRN

## 2018-05-04 NOTE — Assessment & Plan Note (Signed)
Episodic Needs refill of alprazolam for transcontinental flight. Refill

## 2018-05-04 NOTE — Patient Instructions (Signed)
Terbinafine tablets What is this medicine? TERBINAFINE (TER bin a feen) is an antifungal medicine. It is used to treat certain kinds of fungal or yeast infections. This medicine may be used for other purposes; ask your health care provider or pharmacist if you have questions. COMMON BRAND NAME(S): Lamisil, Terbinex What should I tell my health care provider before I take this medicine? They need to know if you have any of these conditions: -drink alcoholic beverages -kidney disease -liver disease -an unusual or allergic reaction to terbinafine, other medicines, foods, dyes, or preservatives -pregnant or trying to get pregnant -breast-feeding How should I use this medicine? Take this medicine by mouth with a full glass of water. Follow the directions on the prescription label. You can take this medicine with food or on an empty stomach. Take your medicine at regular intervals. Do not take your medicine more often than directed. Do not skip doses or stop your medicine early even if you feel better. Do not stop taking except on your doctor's advice. Talk to your pediatrician regarding the use of this medicine in children. Special care may be needed. Overdosage: If you think you have taken too much of this medicine contact a poison control center or emergency room at once. NOTE: This medicine is only for you. Do not share this medicine with others. What if I miss a dose? If you miss a dose, take it as soon as you can. If it is almost time for your next dose, take only that dose. Do not take double or extra doses. What may interact with this medicine? Do not take this medicine with any of the following medications: -thioridazine This medicine may also interact with the following medications: -beta-blockers -caffeine -cimetidine -cyclosporine -medicines for depression, anxiety, or psychotic disturbances -medicines for fungal infections like fluconazole and ketoconazole -medicines for irregular  heartbeat like amiodarone, flecainide and propafenone -rifampin -warfarin This list may not describe all possible interactions. Give your health care provider a list of all the medicines, herbs, non-prescription drugs, or dietary supplements you use. Also tell them if you smoke, drink alcohol, or use illegal drugs. Some items may interact with your medicine. What should I watch for while using this medicine? Visit your doctor or health care provider regularly. Tell your doctor right away if you have nausea or vomiting, loss of appetite, stomach pain on your right upper side, yellow skin, dark urine, light stools, or are over tired. Some fungal infections need many weeks or months of treatment to cure. If you are taking this medicine for a long time, you will need to have important blood work done. What side effects may I notice from receiving this medicine? Side effects that you should report to your doctor or health care professional as soon as possible: -allergic reactions like skin rash or hives, swelling of the face, lips, or tongue -changes in vision -dark urine -fever or infection -general ill feeling or flu-like symptoms -light-colored stools -loss of appetite, nausea -redness, blistering, peeling or loosening of the skin, including inside the mouth -right upper belly pain -unusually weak or tired -yellowing of the eyes or skin Side effects that usually do not require medical attention (report to your doctor or health care professional if they continue or are bothersome): -changes in taste -diarrhea -hair loss -muscle or joint pain -stomach gas -stomach upset This list may not describe all possible side effects. Call your doctor for medical advice about side effects. You may report side effects to FDA at   1-800-FDA-1088. Where should I keep my medicine? Keep out of the reach of children. Store at room temperature below 25 degrees C (77 degrees F). Protect from light. Throw away any  unused medicine after the expiration date. NOTE: This sheet is a summary. It may not cover all possible information. If you have questions about this medicine, talk to your doctor, pharmacist, or health care provider.  2018 Elsevier/Gold Standard (2007-12-03 16:28:07) Fungal Nail Infection Fungal nail infection is a common fungal infection of the toenails or fingernails. This condition affects toenails more often than fingernails. More than one nail may be infected. The condition can be passed from person to person (is contagious). What are the causes? This condition is caused by a fungus. Several types of funguses can cause the infection. These funguses are common in moist and warm areas. If your hands or feet come into contact with the fungus, it may get into a crack in your fingernail or toenail and cause the infection. What increases the risk? The following factors may make you more likely to develop this condition:  Being female.  Having diabetes.  Being of older age.  Living with someone who has the fungus.  Walking barefoot in areas where the fungus thrives, such as showers or locker rooms.  Having poor circulation.  Wearing shoes and socks that cause your feet to sweat.  Having athlete's foot.  Having a nail injury or history of a recent nail surgery.  Having psoriasis.  Having a weak body defense system (immune system).  What are the signs or symptoms? Symptoms of this condition include:  A pale spot on the nail.  Thickening of the nail.  A nail that becomes yellow or brown.  A brittle or ragged nail edge.  A crumbling nail.  A nail that has lifted away from the nail bed.  How is this diagnosed? This condition is diagnosed with a physical exam. Your health care provider may take a scraping or clipping from your nail to test for the fungus. How is this treated? Mild infections do not need treatment. If you have significant nail changes, treatment may  include:  Oral antifungal medicines. You may need to take the medicine for several weeks or several months, and you may not see the results for a long time. These medicines can cause side effects. Ask your health care provider what problems to watch for.  Antifungal nail polish and nail cream. These may be used along with oral antifungal medicines.  Laser treatment of the nail.  Surgery to remove the nail. This may be needed for the most severe infections.  Treatment takes a long time, and the infection may come back. Follow these instructions at home: Medicines  Take or apply over-the-counter and prescription medicines only as told by your health care provider.  Ask your health care provider about using over-the-counter mentholated ointment on your nails. Lifestyle   Do not share personal items, such as towels or nail clippers.  Trim your nails often.  Wash and dry your hands and feet every day.  Wear absorbent socks, and change your socks frequently.  Wear shoes that allow air to circulate, such as sandals or canvas tennis shoes. Throw out old shoes.  Wear rubber gloves if you are working with your hands in wet areas.  Do not walk barefoot in shower rooms or locker rooms.  Do not use a nail salon that does not use clean instruments.  Do not use artificial nails. General instructions  Keep all follow-up visits as told by your health care provider. This is important.  Use antifungal foot powder on your feet and in your shoes. Contact a health care provider if: Your infection is not getting better or it is getting worse after several months. This information is not intended to replace advice given to you by your health care provider. Make sure you discuss any questions you have with your health care provider. Document Released: 09/19/2000 Document Revised: 02/28/2016 Document Reviewed: 03/26/2015 Elsevier Interactive Patient Education  2018 [employer].

## 2018-05-04 NOTE — Progress Notes (Signed)
Name: Sylvia Boyer   MRN: [id number]    DOB: 01-Dec-1960   Date:05/04/2018       Progress Note  Subjective  Chief Complaint  Chief Complaint  Patient presents with  . Nail Problem    L) great toe- has been black under toenail x 1 year  . Anxiety    wants a refill on xanax for flying    Anxiety  Presents for follow-up visit. Symptoms include nervous/anxious behavior. Patient reports no chest pain, compulsions, confusion, decreased concentration, depressed mood, dizziness, dry mouth, excessive worry, feeling of choking, hyperventilation, impotence, insomnia, irritability, malaise, muscle tension, nausea, obsessions, palpitations, panic, restlessness, shortness of breath or suicidal ideas.    Rash  This is a new (nail fungus) problem. The current episode started more than 1 year ago. The problem has been gradually worsening since onset. The affected locations include the left foot. The rash is characterized by blistering. Associated symptoms include nail changes. Pertinent negatives include no cough, diarrhea, fever, joint pain, shortness of breath or sore throat.    Episodic paroxysmal anxiety disorder Episodic Needs refill of alprazolam for transcontinental flight. Refill   Past Medical History:  Diagnosis Date  . Anxiety     Past Surgical History:  Procedure Laterality Date  . VAGINAL HYSTERECTOMY      No family history on file.  Social History   Socioeconomic History  . Marital status: Married    Spouse name: Not on file  . Number of children: Not on file  . Years of education: Not on file  . Highest education level: Not on file  Occupational History  . Not on file  Social Needs  . Financial resource strain: Not on file  . Food insecurity:    Worry: Not on file    Inability: Not on file  . Transportation needs:    Medical: Not on file    Non-medical: Not on file  Tobacco Use  . Smoking status: Never Smoker  . Smokeless tobacco: Never Used  Substance and  Sexual Activity  . Alcohol use: Yes    Alcohol/week: 0.0 oz  . Drug use: No  . Sexual activity: Not Currently  Lifestyle  . Physical activity:    Days per week: Not on file    Minutes per session: Not on file  . Stress: Not on file  Relationships  . Social connections:    Talks on phone: Not on file    Gets together: Not on file    Attends religious service: Not on file    Active member of club or organization: Not on file    Attends meetings of clubs or organizations: Not on file    Relationship status: Not on file  . Intimate partner violence:    Fear of current or ex partner: Not on file    Emotionally abused: Not on file    Physically abused: Not on file    Forced sexual activity: Not on file  Other Topics Concern  . Not on file  Social History Narrative  . Not on file    Allergies  Allergen Reactions  . Erythromycin Nausea Only    Outpatient Medications Prior to Visit  Medication Sig Dispense Refill  . dicyclomine (BENTYL) 10 MG capsule Take 1 capsule by mouth 2 (two) times daily. Reported on 01/21/2016    . ibuprofen (ADVIL,MOTRIN) 800 MG tablet Take 1 tablet (800 mg total) by mouth 3 (three) times daily. (Patient not taking: Reported on 05/04/2018) 30 tablet  11  . mupirocin ointment (BACTROBAN) 2 % Apply 1 application topically 2 (two) times daily. (Patient not taking: Reported on 05/04/2018) 22 g 0  . ALPRAZolam (XANAX) 0.25 MG tablet Take 1 tablet (0.25 mg total) by mouth at bedtime as needed for anxiety. (Patient not taking: Reported on 05/04/2018) 20 tablet 0  . doxycycline (VIBRA-TABS) 100 MG tablet Take 1 tablet (100 mg total) by mouth 2 (two) times daily. 20 tablet 0   No facility-administered medications prior to visit.     Review of Systems  Constitutional: Negative for chills, fever, irritability, malaise/fatigue and weight loss.  HENT: Negative for ear discharge, ear pain and sore throat.   Eyes: Negative for blurred vision.  Respiratory: Negative for  cough, sputum production, shortness of breath and wheezing.   Cardiovascular: Negative for chest pain, palpitations and leg swelling.  Gastrointestinal: Negative for abdominal pain, blood in stool, constipation, diarrhea, heartburn, melena and nausea.  Genitourinary: Negative for dysuria, frequency, hematuria, impotence and urgency.  Musculoskeletal: Negative for back pain, joint pain, myalgias and neck pain.  Skin: Positive for nail changes. Negative for rash.  Neurological: Negative for dizziness, tingling, sensory change, focal weakness and headaches.  Endo/Heme/Allergies: Negative for environmental allergies and polydipsia. Does not bruise/bleed easily.  Psychiatric/Behavioral: Negative for confusion, decreased concentration, depression and suicidal ideas. The patient is nervous/anxious. The patient does not have insomnia.      Objective  Vitals:   05/04/18 1414  BP: 120/70  Pulse: 96  Weight: 202 lb (91.6 kg)  Height: 5\' 8"  (1.727 m)    Physical Exam  Constitutional: No distress.  HENT:  Head: Normocephalic and atraumatic.  Right Ear: External ear normal.  Left Ear: External ear normal.  Nose: Nose normal.  Mouth/Throat: Oropharynx is clear and moist.  Eyes: Pupils are equal, round, and reactive to light. Conjunctivae and EOM are normal. Right eye exhibits no discharge. Left eye exhibits no discharge.  Neck: Normal range of motion. Neck supple. No JVD present. No thyromegaly present.  Cardiovascular: Normal rate, regular rhythm, normal heart sounds and intact distal pulses. Exam reveals no gallop and no friction rub.  No murmur heard. Pulmonary/Chest: Effort normal and breath sounds normal.  Abdominal: Soft. Bowel sounds are normal. She exhibits no mass. There is no tenderness. There is no guarding.  Musculoskeletal: Normal range of motion. She exhibits no edema.  Lymphadenopathy:    She has no cervical adenopathy.  Neurological: She is alert. She has normal reflexes.   Skin: Skin is warm and dry. She is not diaphoretic.  Discolored left great toenail  Nursing note and vitals reviewed.     Assessment & Plan  Problem List Items Addressed This Visit      Other   Episodic paroxysmal anxiety disorder    Episodic Needs refill of alprazolam for transcontinental flight. Refill      Relevant Medications   ALPRAZolam (XANAX) 0.25 MG tablet    Other Visit Diagnoses    Onychomycosis    -  Primary   Will refer to dermatology for eval and possible culture. Penlac script printed if desires prior to oral therapy/lamisil   Relevant Medications   ciclopirox (PENLAC) 8 % solution   Other Relevant Orders   Ambulatory referral to Dermatology   Need for diphtheria-tetanus-pertussis (Tdap) vaccine       t dap booster needed and administered   Relevant Orders   Tdap vaccine greater than or equal to 7yo IM (Completed)      Meds ordered this  encounter  Medications  . ciclopirox (PENLAC) 8 % solution    Sig: Apply topically at bedtime. Apply over nail and near area. Apply daily over previous coat. After seven (7) days, may remove with alcohol and continue cycle.    Dispense:  6.6 mL    Refill:  0  . ALPRAZolam (XANAX) 0.25 MG tablet    Sig: Take 1 tablet (0.25 mg total) by mouth at bedtime as needed for anxiety.    Dispense:  20 tablet    Refill:  0      Dr. [facility]  05/04/18

## 2019-09-12 ENCOUNTER — Other Ambulatory Visit: Payer: Self-pay

## 2019-09-12 DIAGNOSIS — Z20822 Contact with and (suspected) exposure to covid-19: Secondary | ICD-10-CM

## 2019-09-13 LAB — NOVEL CORONAVIRUS, NAA: SARS-CoV-2, NAA: NOT DETECTED

## 2019-09-15 ENCOUNTER — Telehealth: Payer: Self-pay | Admitting: *Deleted

## 2019-09-15 NOTE — Telephone Encounter (Signed)
Patient called given negative covid results . 

## 2021-02-22 ENCOUNTER — Encounter: Payer: Self-pay | Admitting: Family Medicine

## 2021-02-22 ENCOUNTER — Ambulatory Visit: Payer: BC Managed Care – PPO | Admitting: Family Medicine

## 2021-02-22 ENCOUNTER — Other Ambulatory Visit: Payer: Self-pay

## 2021-02-22 VITALS — BP 120/60 | HR 80 | Ht 68.0 in | Wt 197.0 lb

## 2021-02-22 DIAGNOSIS — W57XXXA Bitten or stung by nonvenomous insect and other nonvenomous arthropods, initial encounter: Secondary | ICD-10-CM

## 2021-02-22 DIAGNOSIS — L03312 Cellulitis of back [any part except buttock]: Secondary | ICD-10-CM | POA: Diagnosis not present

## 2021-02-22 DIAGNOSIS — S30860A Insect bite (nonvenomous) of lower back and pelvis, initial encounter: Secondary | ICD-10-CM | POA: Diagnosis not present

## 2021-02-22 MED ORDER — MUPIROCIN 2 % EX OINT: 1.0000 "application " | TOPICAL_OINTMENT | Freq: Two times a day (BID) | CUTANEOUS | 0 refills | Status: DC

## 2021-02-22 MED ORDER — DOXYCYCLINE HYCLATE 100 MG PO TABS: 100.0000 mg | ORAL_TABLET | Freq: Two times a day (BID) | ORAL | 0 refills | Status: DC

## 2021-02-22 NOTE — Progress Notes (Addendum)
Date:  02/22/2021   Name:  Sylvia Boyer   DOB:  06/16/1961   MRN:  [id number]   Chief Complaint: Tick Removal (Pulled tick off x 2 days ago- on back)  Patient is a 60 year old female who presents for a tick bite evaluation. The patient reports the following problems: as noted. Health maintenance has been reviewed up to date.   Lab Results  Component Value Date   CREATININE 0.8 02/27/2015   BUN 14 02/27/2015   Lab Results  Component Value Date   CHOL 163 02/27/2015   HDL 63 02/27/2015   LDLCALC 87 02/27/2015   TRIG 64 02/27/2015   Lab Results  Component Value Date   TSH 2.03 02/27/2015   No results found for: HGBA1C Lab Results  Component Value Date   HGB 13.9 02/27/2015   Lab Results  Component Value Date   ALT 27 02/27/2015   AST 25 02/27/2015     Review of Systems  Constitutional: Negative for chills and fever.  HENT: Negative for drooling, ear discharge, ear pain and sore throat.   Respiratory: Negative for cough, shortness of breath and wheezing.   Cardiovascular: Negative for chest pain, palpitations and leg swelling.  Gastrointestinal: Negative for abdominal pain, blood in stool, constipation, diarrhea and nausea.  Endocrine: Negative for polydipsia.  Genitourinary: Negative for dysuria, frequency, hematuria and urgency.  Musculoskeletal: Negative for back pain, myalgias and neck pain.  Skin: Negative for rash.  Allergic/Immunologic: Negative for environmental allergies.  Neurological: Negative for dizziness and headaches.  Hematological: Does not bruise/bleed easily.  Psychiatric/Behavioral: Negative for suicidal ideas. The patient is not nervous/anxious.     Patient Active Problem List   Diagnosis Date Noted  . Episodic paroxysmal anxiety disorder 05/04/2018  . Routine general medical examination at a health care facility 02/28/2015  . Anxiety 02/28/2015  . 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 Erythromycin  Nausea Only    Past Surgical History:  Procedure Laterality Date  . VAGINAL HYSTERECTOMY      Social History   Tobacco Use  . Smoking status: Never Smoker  . Smokeless tobacco: Never Used  Substance Use Topics  . Alcohol use: Yes    Alcohol/week: 0.0 standard drinks  . Drug use: No     Medication list has been reviewed and updated.  Current Meds  Medication Sig  . ALPRAZolam (XANAX) 0.25 MG tablet Take 1 tablet (0.25 mg total) by mouth at bedtime as needed for anxiety.    PHQ 2/9 Scores 02/22/2021 08/12/2017 02/04/2016 11/19/2015  PHQ - 2 Score 0 0 0 0  PHQ- 9 Score 0 1 - -    GAD 7 : Generalized Anxiety Score 02/22/2021  Nervous, Anxious, on Edge 0  Control/stop worrying 0  Worry too much - different things 0  Trouble relaxing 0  Restless 0  Easily annoyed or irritable 0  Afraid - awful might happen 0  Total GAD 7 Score 0    BP Readings from Last 3 Encounters:  02/22/21 120/60  05/04/18 120/70  12/07/17 120/80    Physical Exam Vitals and nursing note reviewed.  Constitutional:      Appearance: She is well-developed.  HENT:     Head: Normocephalic.     Right Ear: Tympanic membrane, ear canal and external ear normal.     Left Ear: Tympanic membrane, ear canal and external ear normal.     Nose: Nose normal. No congestion or rhinorrhea.  Mouth/Throat:     Mouth: Mucous membranes are moist.  Eyes:     General: Lids are everted, no foreign bodies appreciated. No scleral icterus.       Left eye: No foreign body or hordeolum.     Conjunctiva/sclera: Conjunctivae normal.     Right eye: Right conjunctiva is not injected.     Left eye: Left conjunctiva is not injected.     Pupils: Pupils are equal, round, and reactive to light.  Neck:     Thyroid: No thyromegaly.     Vascular: No JVD.     Trachea: No tracheal deviation.  Cardiovascular:     Rate and Rhythm: Normal rate and regular rhythm.     Heart sounds: Normal heart sounds. No murmur heard. No friction  rub. No gallop.   Pulmonary:     Effort: Pulmonary effort is normal. No respiratory distress.     Breath sounds: Normal breath sounds. No wheezing, rhonchi or rales.  Chest:     Chest wall: No tenderness.  Abdominal:     General: Bowel sounds are normal.     Palpations: Abdomen is soft. There is no mass.     Tenderness: There is no abdominal tenderness. There is no guarding or rebound.  Musculoskeletal:        General: No tenderness. Normal range of motion.     Cervical back: Normal range of motion and neck supple.  Lymphadenopathy:     Cervical: No cervical adenopathy.  Skin:    General: Skin is warm.     Findings: Erythema present. No rash. Urticarial rash: nonannular.  Neurological:     Mental Status: She is alert and oriented to person, place, and time.     Cranial Nerves: No cranial nerve deficit.     Deep Tendon Reflexes: Reflexes normal.  Psychiatric:        Mood and Affect: Mood is not anxious or depressed.     Wt Readings from Last 3 Encounters:  02/22/21 197 lb (89.4 kg)  05/04/18 202 lb (91.6 kg)  12/07/17 196 lb (88.9 kg)    BP 120/60   Pulse 80   Ht 5\' 8"  (1.727 m)   Wt 197 lb (89.4 kg)   BMI 29.95 kg/m   Assessment and Plan: 1. Cellulitis of back except buttock New onset.  Persistent.  Stable.  No annular rash.  There is a area of erythema of 1 cm radius of the left mid back.  This is consistent with a cellulitis.  We will treat with doxycycline 100 mg twice a day as well as application of Bactroban 2% ointment twice a day. - doxycycline (VIBRA-TABS) 100 MG tablet; Take 1 tablet (100 mg total) by mouth 2 (two) times daily.  Dispense: 20 tablet; Refill: 0 - mupirocin ointment (BACTROBAN) 2 %; Apply 1 application topically 2 (two) times daily.  Dispense: 22 g; Refill: 0  2. Tick bite of lower back, initial encounter New onset.  As noted above this is a tick bite which sounds like a deer tick and we will cover with doxycycline for the likely local  infection. - doxycycline (VIBRA-TABS) 100 MG tablet; Take 1 tablet (100 mg total) by mouth 2 (two) times daily.  Dispense: 20 tablet; Refill: 0 - mupirocin ointment (BACTROBAN) 2 %; Apply 1 application topically 2 (two) times daily.  Dispense: 22 g; Refill: 0

## 2021-02-22 NOTE — Patient Instructions (Signed)

## 2022-11-17 ENCOUNTER — Emergency Department: Payer: BC Managed Care – PPO

## 2022-11-17 ENCOUNTER — Observation Stay
Admission: EM | Admit: 2022-11-17 | Discharge: 2022-11-18 | Disposition: A | Discharge status: Home / Self Care | Payer: BC Managed Care – PPO | Attending: Internal Medicine | Admitting: Internal Medicine

## 2022-11-17 ENCOUNTER — Other Ambulatory Visit: Payer: Self-pay

## 2022-11-17 ENCOUNTER — Ambulatory Visit: Payer: Self-pay

## 2022-11-17 DIAGNOSIS — E876 Hypokalemia: Secondary | ICD-10-CM | POA: Diagnosis present

## 2022-11-17 DIAGNOSIS — I82411 Acute embolism and thrombosis of right femoral vein: Principal | ICD-10-CM | POA: Insufficient documentation

## 2022-11-17 DIAGNOSIS — Z79899 Other long term (current) drug therapy: Secondary | ICD-10-CM | POA: Diagnosis not present

## 2022-11-17 DIAGNOSIS — R609 Edema, unspecified: Secondary | ICD-10-CM | POA: Diagnosis present

## 2022-11-17 DIAGNOSIS — Z86718 Personal history of other venous thrombosis and embolism: Secondary | ICD-10-CM | POA: Diagnosis present

## 2022-11-17 DIAGNOSIS — F419 Anxiety disorder, unspecified: Secondary | ICD-10-CM | POA: Diagnosis present

## 2022-11-17 DIAGNOSIS — I82491 Acute embolism and thrombosis of other specified deep vein of right lower extremity: Secondary | ICD-10-CM

## 2022-11-17 DIAGNOSIS — I824Z1 Acute embolism and thrombosis of unspecified deep veins of right distal lower extremity: Secondary | ICD-10-CM

## 2022-11-17 LAB — PROTIME-INR
INR: 1 (ref 0.8–1.2)
Prothrombin Time: 13.4 seconds (ref 11.4–15.2)

## 2022-11-17 LAB — COMPREHENSIVE METABOLIC PANEL
ALT: 21 U/L (ref 0–44)
AST: 22 U/L (ref 15–41)
Albumin: 4.1 g/dL (ref 3.5–5.0)
Alkaline Phosphatase: 62 U/L (ref 38–126)
Anion gap: 13 (ref 5–15)
BUN: 13 mg/dL (ref 8–23)
CO2: 25 mmol/L (ref 22–32)
Calcium: 9.7 mg/dL (ref 8.9–10.3)
Chloride: 102 mmol/L (ref 98–111)
Creatinine, Ser: 0.71 mg/dL (ref 0.44–1.00)
GFR, Estimated: >60 mL/min (ref >60)
Glucose, Bld: 109 mg/dL — ABNORMAL HIGH (ref 70–99)
Potassium: 3 mmol/L — ABNORMAL LOW (ref 3.5–5.1)
Sodium: 140 mmol/L (ref 135–145)
Total Bilirubin: 0.8 mg/dL (ref 0.3–1.2)
Total Protein: 7.5 g/dL (ref 6.5–8.1)

## 2022-11-17 LAB — CBC WITH DIFFERENTIAL/PLATELET
Abs Immature Granulocytes: 0.04 10*3/uL (ref 0.00–0.07)
Basophils Absolute: 0.1 10*3/uL (ref 0.0–0.1)
Basophils Relative: 0 %
Eosinophils Absolute: 0.2 10*3/uL (ref 0.0–0.5)
Eosinophils Relative: 2 %
HCT: 40.9 % (ref 36.0–46.0)
Hemoglobin: 14.3 g/dL (ref 12.0–15.0)
Immature Granulocytes: 0 %
Lymphocytes Relative: 19 %
Lymphs Abs: 2.3 10*3/uL (ref 0.7–4.0)
MCH: 30.8 pg (ref 26.0–34.0)
MCHC: 35 g/dL (ref 30.0–36.0)
MCV: 88.1 fL (ref 80.0–100.0)
Monocytes Absolute: 0.9 10*3/uL (ref 0.1–1.0)
Monocytes Relative: 7 %
Neutro Abs: 8.5 10*3/uL — ABNORMAL HIGH (ref 1.7–7.7)
Neutrophils Relative %: 72 %
Platelets: 257 10*3/uL (ref 150–400)
RBC: 4.64 MIL/uL (ref 3.87–5.11)
RDW: 12.5 % (ref 11.5–15.5)
WBC: 11.9 10*3/uL — ABNORMAL HIGH (ref 4.0–10.5)
nRBC: 0 % (ref 0.0–0.2)

## 2022-11-17 MED ORDER — POTASSIUM CHLORIDE CRYS ER 20 MEQ PO TBCR
40.0000 meq | EXTENDED_RELEASE_TABLET | Freq: Once | ORAL | Status: AC
  Administered 2022-11-18: 40 meq via ORAL
  Filled 2022-11-17: qty 2

## 2022-11-17 MED ORDER — ALPRAZOLAM 0.25 MG PO TABS: 0.2500 mg | ORAL_TABLET | Freq: Every evening | ORAL | Status: DC | PRN

## 2022-11-17 MED ORDER — ONDANSETRON HCL 4 MG PO TABS: 4.0000 mg | ORAL_TABLET | Freq: Four times a day (QID) | ORAL | Status: DC | PRN

## 2022-11-17 MED ORDER — HEPARIN (PORCINE) 25000 UT/250ML-% IV SOLN
1400.0000 [IU]/h | INTRAVENOUS | Status: DC
  Administered 2022-11-17: 1400 [IU]/h via INTRAVENOUS
  Filled 2022-11-17: qty 250

## 2022-11-17 MED ORDER — ACETAMINOPHEN 650 MG RE SUPP: 650.0000 mg | Freq: Four times a day (QID) | RECTAL | Status: DC | PRN

## 2022-11-17 MED ORDER — HEPARIN BOLUS VIA INFUSION: 4000.0000 [IU] | Freq: Once | INTRAVENOUS | Status: DC

## 2022-11-17 MED ORDER — ACETAMINOPHEN 325 MG PO TABS: 650.0000 mg | ORAL_TABLET | Freq: Four times a day (QID) | ORAL | Status: DC | PRN

## 2022-11-17 MED ORDER — SODIUM CHLORIDE 0.9 % IV SOLN: INTRAVENOUS | Status: AC

## 2022-11-17 MED ORDER — ONDANSETRON HCL 4 MG/2ML IJ SOLN: 4.0000 mg | Freq: Four times a day (QID) | INTRAMUSCULAR | Status: DC | PRN

## 2022-11-17 MED ORDER — HEPARIN BOLUS VIA INFUSION
5800.0000 [IU] | Freq: Once | INTRAVENOUS | Status: AC
  Administered 2022-11-17: 5800 [IU] via INTRAVENOUS
  Filled 2022-11-17: qty 5800

## 2022-11-17 NOTE — Consult Note (Signed)
ANTICOAGULATION CONSULT NOTE - Initial Consult  Pharmacy Consult for heparin infusion Indication: DVT  Allergies  Allergen Reactions   Erythromycin Nausea Only    Patient Measurements: Height: 5' 8"$  (172.7 cm) Weight: 89.4 kg (197 lb 1.5 oz) IBW/kg (Calculated) : 63.9 Heparin Dosing Weight: 82.7 kg  Vital Signs: Temp: 98.3 F (36.8 C) (02/12 1656) BP: 146/74 (02/12 1656) Pulse Rate: 105 (02/12 1656)  Labs: No results for input(s): "HGB", "HCT", "PLT", "APTT", "LABPROT", "INR", "HEPARINUNFRC", "HEPRLOWMOCWT", "CREATININE", "CKTOTAL", "CKMB", "TROPONINIHS" in the last 72 hours.  CrCl cannot be calculated (Patient's most recent lab result is older than the maximum 21 days allowed.).   Medical History: Past Medical History:  Diagnosis Date   Anxiety     Medications:  PTA: N/A Inpatient: Heparin infusion (2/12 >>) Allergies: No AC/APT related allergies  Assessment: 62 year old female with no pertaining medical history presents to ED with complaint of red and tender right calf x5 days and pitting edema. US doppler study positive for deep venous thrombosis involving the right femoral, popliteal and calf veins. Pharmacy consulted for management of heparin infusion in the setting of DVT.  Date Time aPTT/HL Rate/Comment   Goal of Therapy:  Heparin level 0.3-0.7 units/ml Monitor platelets by anticoagulation protocol: Yes   Plan:  Give 5800 units bolus x1; then start heparin infusion at 1400 units/hr Check anti-Xa level in 6 hours and daily once consecutively therapeutic. Continue to monitor H&H and platelets daily while on heparin gtt.  [place] Pharmacist 11/17/2022 8:44 PM

## 2022-11-17 NOTE — ED Triage Notes (Addendum)
Pt c/o red and tender right calf X 5 days, at chiropractor's today- he told her she had pitting edema and recommended [place]. Pt does not take blood thinners. Pt reports sitting with dogs on her lap for prolonged periods of time. Right inner ankle has mild +1 pitting edema no redness noted, pt reports tenderness/aching. Pt has been placing hot and cold compresses and taking OTC meds w/ out relief.

## 2022-11-17 NOTE — Assessment & Plan Note (Deleted)
Patient presents to the ER for evaluation of right leg swelling and pain and had right lower extremity venous Doppler which was positive for deep venous thrombosis involving the right femoral, popliteal and calf veins. Continue heparin drip initiated in the ER Vascular surgery consult to evaluate for possible thrombectomy

## 2022-11-17 NOTE — H&P (Addendum)
History and Physical    Patient: Sylvia Boyer [id number] DOB: [birth year] DOA: 11/17/2022 DOS: the patient was seen and examined on 11/17/2022 PCP: Juline Patch, MD  Patient coming from: Home  Chief Complaint: No chief complaint on file.  HPI: Sylvia Boyer is a 62 y.o. female with medical history significant for anxiety disorder who was sent to the ER from her chiropractor's office for right lower extremity ultrasound to evaluate right lower extremity swelling for about 5 days. Patient states that she is usually very active but over the last 1 month she had been taking care of her dog who had surgery.  She states that she has been sitting for long hours carrying this dog in her lap and about 5 days ago she noted some swelling involving her right leg associated with pain which had progressively worsened.  She has used hot and cold compresses and had taken over-the-counter medications with no improvement in her swelling or pain. Her chiropractor had seen her today and recommended that she get a right lower extremity ultrasound to rule out DVT. She denies having any chest pain, no shortness of breath, no fever, no chills, no headache, no palpitations, no diaphoresis, no abdominal pain, no changes in her bowel habits, no blurred vision, no focal deficit. She has not had any recent surgery and denies recent travel.  Denies history of hormonal drug use.  Has a family history significant for venous thromboembolic disease. Right lower extremity venous Doppler is positive for deep venous thrombosis involving the right femoral, popliteal and calf veins. She was started on a heparin drip and will be referred to observation status for further evaluation.       Review of Systems: As mentioned in the history of present illness. All other systems reviewed and are negative. Past Medical History:  Diagnosis Date   Anxiety    Past Surgical History:  Procedure Laterality Date   VAGINAL  HYSTERECTOMY     Social History:  reports that she has never smoked. She has never used smokeless tobacco. She reports current alcohol use. She reports that she does not use drugs.  Allergies  Allergen Reactions   Erythromycin Nausea Only    History reviewed. No pertinent family history.  Prior to Admission medications   Medication Sig Start Date End Date Taking? Authorizing Provider  amoxicillin (AMOXIL) 500 MG capsule Take 500 mg by mouth 2 (two) times daily. 11/13/22  Yes [provider]  hydrochlorothiazide (HYDRODIURIL) 25 MG tablet Take 25 mg by mouth daily as needed. 11/13/22  Yes [provider]  ivermectin (STROMECTOL) 3 MG TABS tablet Take 5 tablets by mouth daily. 11/13/22  Yes [provider]  Vitamin D, Ergocalciferol, (DRISDOL) 1.25 MG (50000 UNIT) CAPS capsule Take 50,000 Units by mouth every 7 (seven) days. 11/13/22  Yes [provider]  ALPRAZolam (XANAX) 0.25 MG tablet Take 1 tablet (0.25 mg total) by mouth at bedtime as needed for anxiety. 05/04/18   Juline Patch, MD  dicyclomine (BENTYL) 10 MG capsule Take 1 capsule by mouth 2 (two) times daily. Reported on 01/21/2016 Patient not taking: Reported on 02/22/2021 12/01/14   [provider]  ibuprofen (ADVIL,MOTRIN) 800 MG tablet Take 1 tablet (800 mg total) by mouth 3 (three) times daily. Patient not taking: Reported on 05/04/2018 11/19/15   Juline Patch, MD  mupirocin ointment (BACTROBAN) 2 % Apply 1 application topically 2 (two) times daily. Patient not taking: Reported on 11/17/2022 02/22/21   Otilio Miu  C, MD  ondansetron (ZOFRAN) 8 MG tablet Take 4 mg by mouth 2 (two) times daily as needed.    [provider]    Physical Exam: Vitals:   11/17/22 1656 11/17/22 1746 11/17/22 2320  BP: (!) 146/74  137/65  Pulse: (!) 105  92  Resp: 16  16  Temp: 98.3 F (36.8 C)  98.2 F (36.8 C)  TempSrc:   Oral  SpO2: 94%  97%  Weight:  89.4 kg   Height:  5' 8"$  (1.727 m)     Physical Exam Vitals and nursing note reviewed.  Constitutional:      Appearance: Normal appearance.  HENT:     Head: Normocephalic and atraumatic.     Nose: Nose normal.     Mouth/Throat:     Mouth: Mucous membranes are moist.  Eyes:     Conjunctiva/sclera: Conjunctivae normal.  Cardiovascular:     Rate and Rhythm: Normal rate and regular rhythm.  Pulmonary:     Effort: Pulmonary effort is normal.     Breath sounds: Normal breath sounds.  Abdominal:     General: Abdomen is flat. Bowel sounds are normal.     Palpations: Abdomen is soft.  Musculoskeletal:        General: Swelling present.     Cervical back: Normal range of motion and neck supple.     Right lower leg: Edema present.  Skin:    General: Skin is warm and dry.  Neurological:     General: No focal deficit present.     Mental Status: She is alert.  Psychiatric:        Mood and Affect: Mood normal.        Behavior: Behavior normal.     Data Reviewed: Relevant notes from primary care and specialist visits, past discharge summaries as available in EHR, including Care Everywhere. Prior diagnostic testing as pertinent to current admission diagnoses Updated medications and problem lists for reconciliation ED course, including vitals, labs, imaging, treatment and response to treatment Triage notes, nursing and pharmacy notes and ED provider's notes Notable results as noted in HPI Labs reviewed.  Sodium 140, potassium 3.0, chloride 102, bicarb 25, glucose 109, BUN 13, creatinine 0.71, calcium 9.7, total protein 7.5, albumin 4.1, AST 22, ALT 21, alk phos 62, PT 13.4, INR 1.0, white count 11.9, hemoglobin 14.3, hematocrit 40.9.  Platelet count 257 There are no new results to review at this time.  Assessment and Plan: * DVT, lower extremity, proximal, acute ([place]) Patient presents to the ER for evaluation of right leg swelling and pain and had right lower extremity venous Doppler which was positive for deep venous  thrombosis involving the right femoral, popliteal and calf veins. Continue heparin drip initiated in the ER Vascular surgery consult to evaluate for possible thrombectomy  Hypokalemia Related to hydrochlorothiazide use Supplement potassium  Anxiety Continue as needed Xanax      Advance Care Planning:   Code Status: Full Code   Consults: Vascular surgery  Family Communication: Greater than 50% of time was spent discussing patient's condition and plan of care with her at the bedside.  All questions and concerns have been addressed.  She verbalizes understanding and  Severity of Illness: The appropriate patient status for this patient is OBSERVATION. Observation status is judged to be reasonable and necessary in order to provide the required intensity of service to ensure the patient's safety. The patient's presenting symptoms, physical exam findings, and initial radiographic and laboratory data in the  context of their medical condition is felt to place them at decreased risk for further clinical deterioration. Furthermore, it is anticipated that the patient will be medically stable for discharge from the hospital within 2 midnights of admission.   Author: Collier Bullock, MD 11/17/2022 11:49 PM  For on call review [email or web address].

## 2022-11-17 NOTE — Telephone Encounter (Signed)
  Chief Complaint: Rt ankle and calf swelling Symptoms: above Frequency: weds last week Pertinent Negatives: Patient denies fever, SOB, redness Disposition: [x] ED /[] Urgent Care (no appt availability in office) / [] Appointment(In office/virtual)/ []  [facility]/ [] Home Care/ [] Refused Recommended Disposition /[] [facility]/ []  Follow-up with PCP Additional Notes: PT feels this might be a blood clot. Pt called UC and they told her that she should be seen in ED. PT did want to come to office now to be seen. Pt is now en route to [facility].  Reason for Disposition  [1] Thigh, calf, or ankle swelling AND [2] only 1 side  Answer Assessment - Initial Assessment Questions 1. ONSET: "When did the swelling start?" (e.g., minutes, hours, days)     Last weds 2. LOCATION: "What part of the leg is swollen?"  "Are both legs swollen or just one leg?"     Right leg ankle and calf 3. SEVERITY: "How bad is the swelling?" (e.g., localized; mild, moderate, severe)   - Localized: Small area of swelling localized to one leg.   - MILD pedal edema: Swelling limited to foot and ankle, pitting edema < 1/4 inch (6 mm) deep, rest and elevation eliminate most or all swelling.   - MODERATE edema: Swelling of lower leg to knee, pitting edema > 1/4 inch (6 mm) deep, rest and elevation only partially reduce swelling.   - SEVERE edema: Swelling extends above knee, facial or hand swelling present.      4. REDNESS: "Does the swelling look red or infected?"     no 5. PAIN: "Is the swelling painful to touch?" If Yes, ask: "How painful is it?"   (Scale 1-10; mild, moderate or severe)     yes 6. FEVER: "Do you have a fever?" If Yes, ask: "What is it, how was it measured, and when did it start?"      no 7. CAUSE: "What do you think is causing the leg swelling?"     Blood clot 8. MEDICAL HISTORY: "Do you have a history of blood clots (e.g., DVT), cancer, heart failure, kidney disease, or liver failure?"       9. RECURRENT SYMPTOM: "Have you had leg swelling before?" If Yes, ask: "When was the last time?" "What happened that time?"      10. OTHER SYMPTOMS: "Do you have any other symptoms?" (e.g., chest pain, difficulty breathing)        11. PREGNANCY: "Is there any chance you are pregnant?" "When was your last menstrual period?"  Protocols used: Leg Swelling and Edema-A-AH

## 2022-11-17 NOTE — ED Provider Notes (Signed)
[facility] Provider Note  Patient Contact: 7:00 PM (approximate)   History   No chief complaint on file.   HPI  Sylvia Boyer is a 62 y.o. female who presents emergency department complaining of gross edema, erythema and pain of her right lower extremity.  Patient states that over the last month she has had decreased mobility secondary to taking care of an animal.  She states that typically she is very active, walking distances every day.  Patient states that she has been sitting on the couch with and laying over her leg.  Over the last 5 days she has had increasing pain, erythema and edema.  No chest pain, shortness of breath.  No history of bleeding or clotting disorders.  Patient denies any wounds to the extremity prior to the onset.  No fevers or chills.     Physical Exam   Triage Vital Signs: ED Triage Vitals  Enc Vitals Group     BP 11/17/22 1656 (!) 146/74     Pulse Rate 11/17/22 1656 (!) 105     Resp 11/17/22 1656 16     Temp 11/17/22 1656 98.3 F (36.8 C)     Temp src --      SpO2 11/17/22 1656 94 %     Weight 11/17/22 1746 197 lb 1.5 oz (89.4 kg)     Height 11/17/22 1746 5\' 8"  (1.727 m)     Head Circumference --      Peak Flow --      Pain Score 11/17/22 1657 4     Pain Loc --      Pain Edu? --      Excl. in GC? --     Most recent vital signs: Vitals:   11/17/22 1656  BP: (!) 146/74  Pulse: (!) 105  Resp: 16  Temp: 98.3 F (36.8 C)  SpO2: 94%     General: Alert and in no acute distress.  Cardiovascular:  Good peripheral perfusion Respiratory: Normal respiratory effort without tachypnea or retractions. Lungs CTAB. Good air entry to the bases with no decreased or absent breath sounds Musculoskeletal: Full range of motion to all extremities.  Visualization of the right lower extremity reveals edema, erythema.  Patient is tender over the calf extending into the popliteal and thigh region.  No palpable abnormality.  Area is  warm but does not appear to be cellulitic on exam.  Pulses sensation intact distally. Neurologic:  No gross focal neurologic deficits are appreciated.  Skin:   No rash noted Other:   ED Results / Procedures / Treatments   Labs (all labs ordered are listed, but only abnormal results are displayed) Labs Reviewed  COMPREHENSIVE METABOLIC PANEL - Abnormal; Notable for the following components:      Result Value   Potassium 3.0 (*)    Glucose, Bld 109 (*)    All other components within normal limits  CBC WITH DIFFERENTIAL/PLATELET - Abnormal; Notable for the following components:   WBC 11.9 (*)    Neutro Abs 8.5 (*)    All other components within normal limits  PROTIME-INR  HEPARIN LEVEL (UNFRACTIONATED)  CBC     EKG     RADIOLOGY  I personally viewed, evaluated, and interpreted these images as part of my medical decision making, as well as reviewing the written report by the radiologist.  ED Provider Interpretation: Ultrasound reveals DVT in the femoral, popliteal and calf region.  US Venous Img Lower Unilateral Right (DVT)  Result Date: 11/17/2022 CLINICAL DATA:  Right lower extremity swelling for 5 days EXAM: RIGHT LOWER EXTREMITY VENOUS DOPPLER ULTRASOUND TECHNIQUE: Gray-scale sonography with graded compression, as well as color Doppler and duplex ultrasound were performed to evaluate the lower extremity deep venous systems from the level of the common femoral vein and including the common femoral, femoral, profunda femoral, popliteal and calf veins including the posterior tibial, peroneal and gastrocnemius veins when visible. The superficial great saphenous vein was also interrogated. Spectral Doppler was utilized to evaluate flow at rest and with distal augmentation maneuvers in the common femoral, femoral and popliteal veins. COMPARISON:  None Available. FINDINGS: Contralateral Common Femoral Vein: Respiratory phasicity is normal and symmetric with the symptomatic side. No  evidence of thrombus. Normal compressibility. Common Femoral Vein: No evidence of thrombus. Normal compressibility, respiratory phasicity and response to augmentation. Saphenofemoral Junction: No evidence of thrombus. Normal compressibility and flow on color Doppler imaging. Profunda Femoral Vein: No evidence of thrombus. Normal compressibility and flow on color Doppler imaging. Femoral Vein: Incomplete compression and poor blood flow on color Doppler Popliteal Vein: Incomplete compression and poor flow on color Doppler Calf Veins: Incomplete compression and poor flow on color Doppler Superficial Great Saphenous Vein: No evidence of thrombus. Normal compressibility. Venous Reflux:  None. Other Findings:  None. Critical Value/emergent results were called by telephone at the time of interpretation on 11/17/2022 at 6:16 pm to provider Dr. Sidney Ace, who verbally acknowledged these results. IMPRESSION: Positive for deep venous thrombosis involving the right femoral, popliteal and calf veins. Electronically Signed   By: Karen Kays M.D.   On: 11/17/2022 18:16    PROCEDURES:  Critical Care performed: No  Procedures   MEDICATIONS ORDERED IN ED: Medications  heparin ADULT infusion 100 units/mL (25000 units/267mL) (1,400 Units/hr Intravenous New Bag/Given 11/17/22 2126)  heparin bolus via infusion 5,800 Units (5,800 Units Intravenous Bolus from Bag 11/17/22 2126)     IMPRESSION / MDM / ASSESSMENT AND PLAN / ED COURSE  I reviewed the triage vital signs and the nursing notes.                                 Differential diagnosis includes, but is not limited to, DVT, cellulitis, musculoskeletal injury, compartment syndrome  Patient's presentation is most consistent with acute presentation with potential threat to life or bodily function.   Patient's diagnosis is consistent with DVT.  Patient presents emergency department with nontraumatic erythema and edema in the right lower extremity.  Typically she is  very active but has been sitting for prolonged periods over the last month.  No history of bleeding or clotting disorders.  No recent surgery.  Patient denies any chest pain, shortness of breath.  Patient was sent for possible DVT and ultrasound confirms that DVT is present to the calf, popliteal and femoral region.  I reached out to our on-call vascular surgeon, Dr. Gilda Crease.  Vascular advises that the patient is on the cusp for thrombectomy versus heparin.  At this time as it does not extend into the common femoral he recommends initially treating with heparin, and vascular surgery will follow while admitted and determine whether she ultimately does or does not need a thrombectomy.  Patient had no chest pain, shortness of breath.  Basic labs ordered, heparin initiated.  Will reach out to the hospitalist team for admission at this time.Marland Kitchen    FINAL CLINICAL IMPRESSION(S) / ED DIAGNOSES   Final diagnoses:  Acute deep vein thrombosis (DVT) of other specified vein of right lower extremity (HCC)     Rx / DC Orders   ED Discharge Orders     None        Note:  This document was prepared using Dragon voice recognition software and may include unintentional dictation errors.   Lanette Hampshire 11/17/22 2248    Georga Hacking, MD 11/18/22 Jerene Bears

## 2022-11-17 NOTE — Assessment & Plan Note (Signed)
Related to hydrochlorothiazide use Supplement potassium

## 2022-11-17 NOTE — Assessment & Plan Note (Signed)
- 

## 2022-11-17 NOTE — Assessment & Plan Note (Signed)
Patient presents to the ER for evaluation of right leg swelling and pain and had right lower extremity venous Doppler which was positive for deep venous thrombosis involving the right femoral, popliteal and calf veins. Continue heparin drip initiated in the ER Vascular surgery consult to evaluate for possible thrombectomy

## 2022-11-18 ENCOUNTER — Ambulatory Visit: Payer: BC Managed Care – PPO | Admitting: Family Medicine

## 2022-11-18 DIAGNOSIS — F419 Anxiety disorder, unspecified: Secondary | ICD-10-CM | POA: Diagnosis not present

## 2022-11-18 DIAGNOSIS — I82411 Acute embolism and thrombosis of right femoral vein: Secondary | ICD-10-CM | POA: Diagnosis not present

## 2022-11-18 DIAGNOSIS — E876 Hypokalemia: Secondary | ICD-10-CM

## 2022-11-18 LAB — CBC
HCT: 38.3 % (ref 36.0–46.0)
Hemoglobin: 13.2 g/dL (ref 12.0–15.0)
MCH: 30.3 pg (ref 26.0–34.0)
MCHC: 34.5 g/dL (ref 30.0–36.0)
MCV: 88 fL (ref 80.0–100.0)
Platelets: 257 10*3/uL (ref 150–400)
RBC: 4.35 MIL/uL (ref 3.87–5.11)
RDW: 12.3 % (ref 11.5–15.5)
WBC: 10.4 10*3/uL (ref 4.0–10.5)
nRBC: 0 % (ref 0.0–0.2)

## 2022-11-18 LAB — BASIC METABOLIC PANEL
Anion gap: 8 (ref 5–15)
BUN: 15 mg/dL (ref 8–23)
CO2: 27 mmol/L (ref 22–32)
Calcium: 9.3 mg/dL (ref 8.9–10.3)
Chloride: 105 mmol/L (ref 98–111)
Creatinine, Ser: 0.74 mg/dL (ref 0.44–1.00)
GFR, Estimated: >60 mL/min (ref >60)
Glucose, Bld: 121 mg/dL — ABNORMAL HIGH (ref 70–99)
Potassium: 3.3 mmol/L — ABNORMAL LOW (ref 3.5–5.1)
Sodium: 140 mmol/L (ref 135–145)

## 2022-11-18 LAB — HEPARIN LEVEL (UNFRACTIONATED): Heparin Unfractionated: 0.61 IU/mL (ref 0.30–0.70)

## 2022-11-18 LAB — HIV ANTIBODY (ROUTINE TESTING W REFLEX): HIV Screen 4th Generation wRfx: NONREACTIVE

## 2022-11-18 LAB — MAGNESIUM: Magnesium: 1.8 mg/dL (ref 1.7–2.4)

## 2022-11-18 MED ORDER — APIXABAN 5 MG PO TABS
5.0000 mg | ORAL_TABLET | Freq: Once | ORAL | Status: AC
  Administered 2022-11-18: 5 mg via ORAL
  Filled 2022-11-18: qty 1

## 2022-11-18 MED ORDER — APIXABAN 5 MG PO TABS: 5.0000 mg | ORAL_TABLET | Freq: Two times a day (BID) | ORAL | Status: DC

## 2022-11-18 MED ORDER — APIXABAN (ELIQUIS) VTE STARTER PACK (10MG AND 5MG): ORAL_TABLET | ORAL | 0 refills | Status: DC

## 2022-11-18 MED ORDER — POTASSIUM CHLORIDE CRYS ER 20 MEQ PO TBCR
40.0000 meq | EXTENDED_RELEASE_TABLET | Freq: Once | ORAL | Status: AC
  Administered 2022-11-18: 40 meq via ORAL
  Filled 2022-11-18: qty 2

## 2022-11-18 MED ORDER — APIXABAN 5 MG PO TABS
5.0000 mg | ORAL_TABLET | Freq: Two times a day (BID) | ORAL | Status: DC
  Administered 2022-11-18: 5 mg via ORAL
  Filled 2022-11-18: qty 1

## 2022-11-18 MED ORDER — APIXABAN 5 MG PO TABS: 5.0000 mg | ORAL_TABLET | Freq: Two times a day (BID) | ORAL | 1 refills | Status: DC

## 2022-11-18 MED ORDER — APIXABAN 5 MG PO TABS: 10.0000 mg | ORAL_TABLET | Freq: Two times a day (BID) | ORAL | Status: DC

## 2022-11-18 NOTE — Consult Note (Signed)
ANTICOAGULATION CONSULT NOTE - Initial Consult  Pharmacy Consult for heparin infusion Indication: DVT  Allergies  Allergen Reactions   Erythromycin Nausea Only    Patient Measurements: Height: 5' 8"$  (172.7 cm) Weight: 90.4 kg (199 lb 4.7 oz) IBW/kg (Calculated) : 63.9 Heparin Dosing Weight: 82.7 kg  Vital Signs: Temp: 98.4 F (36.9 C) (02/13 0440) Temp Source: Oral (02/13 0440) BP: 133/74 (02/13 0440) Pulse Rate: 92 (02/13 0440)  Labs: Recent Labs    11/17/22 2048 11/18/22 0433  HGB 14.3 13.2  HCT 40.9 38.3  PLT 257 257  LABPROT 13.4  --   INR 1.0  --   HEPARINUNFRC  --  0.61  CREATININE 0.71 0.74    Estimated Creatinine Clearance: 84.7 mL/min (by C-G formula based on SCr of 0.74 mg/dL).   Medical History: Past Medical History:  Diagnosis Date   Anxiety     Medications:  PTA: N/A Inpatient: Heparin infusion (2/12 >>) Allergies: No AC/APT related allergies  Assessment: 62 year old female with no pertaining medical history presents to ED with complaint of red and tender right calf x5 days and pitting edema. US doppler study positive for deep venous thrombosis involving the right femoral, popliteal and calf veins. Pharmacy consulted for management of heparin infusion in the setting of DVT.  Date Time aPTT/HL Rate/Comment 2/13     0433    0.61                Therapeutic @ 1400 un/hr  Goal of Therapy:  Heparin level 0.3-0.7 units/ml Monitor platelets by anticoagulation protocol: Yes   Plan:  2/13:  HL @ 0433 = 0.61, therapeutic X 1  Will continue pt on current rate and recheck HL on 2/13 @ 1100.  Continue to monitor H&H and platelets daily while on heparin gtt.  Quincy Boy D Clinical Pharmacist 11/18/2022 5:15 AM

## 2022-11-18 NOTE — TOC CM/SW Note (Signed)
Patient has orders to discharge home today. Chart reviewed. No TOC needs identified. CSW signing off.  Dayton Scrape, Ashley

## 2022-11-18 NOTE — Progress Notes (Signed)
Pt discharged per MD order. IV removed. Discharge instructions reviewed with this pt. Pt verbalized understanding. All questions answered to pt satisfaction. Pt taken out in wheelchair by staff.

## 2022-11-18 NOTE — Consult Note (Signed)
            MRN : [id number]  Sylvia Boyer is a 62 y.o. (04/24/1961) female who presents with chief complaint of   History of Present Illness:  This patient was not on ly schedule and was not seen by me on this date   Current Meds  Medication Sig   amoxicillin (AMOXIL) 500 MG capsule Take 500 mg by mouth 2 (two) times daily.   hydrochlorothiazide (HYDRODIURIL) 25 MG tablet Take 25 mg by mouth daily as needed.   ivermectin (STROMECTOL) 3 MG TABS tablet Take 5 tablets by mouth daily.   Vitamin D, Ergocalciferol, (DRISDOL) 1.25 MG (50000 UNIT) CAPS capsule Take 50,000 Units by mouth every 7 (seven) days.    Past Medical History:  Diagnosis Date   Anxiety     Past Surgical History:  Procedure Laterality Date   VAGINAL HYSTERECTOMY      Social History Social History   Tobacco Use   Smoking status: Never   Smokeless tobacco: Never  Substance Use Topics   Alcohol use: Yes    Alcohol/week: 0.0 standard drinks of alcohol   Drug use: No    Family History History reviewed. No pertinent family history.  Allergies  Allergen Reactions   Erythromycin Nausea Only     Assessment/Plan This patient was not on ly schedule and was not seen by me on this date   Hortencia Pilar, MD  11/18/2022 8:08 AM

## 2022-11-18 NOTE — Hospital Course (Addendum)
Taken from H&P.  : Sylvia Boyer is a 62 y.o. female with medical history significant for anxiety disorder who was sent to the ER from her chiropractor's office for right lower extremity ultrasound to evaluate right lower extremity swelling for about 5 days. Patient states that she is usually very active but over the last 1 month she had been taking care of her dog who had surgery.  She states that she has been sitting for long hours carrying this dog in her lap and about 5 days ago she noted some swelling involving her right leg associated with pain which had progressively worsened.   Right lower extremity venous Doppler is positive for deep venous thrombosis involving the right femoral, popliteal and calf veins. She was started on a heparin drip and will be referred to observation status for further evaluation.  2/13: Vital stable.  Some improvement in hypokalemia but remained low at 3.3.  Magnesium at 1.8.  Replating potassium.  Heparin was switched with Eliquis.  Patient will take 10 mg twice daily for first week followed by 5 mg twice daily.  She will follow-up with vascular surgery and they can determine the duration.  She was given prescription for total of 55-month  She will continue with rest of her home medications and need to have a close follow-up with her providers for further recommendations.

## 2022-11-18 NOTE — Consult Note (Signed)
Hospital Consult    Reason for Consult:  Right Lower leg swelling  Requesting Physician:  Dr Collier Bullock MD MRN #:  [id number]  History of Present Illness: This is a 62 y.o. female with medical history significant for anxiety disorder who was sent to the ER from her chiropractor's office for right lower extremity ultrasound to evaluate right lower extremity swelling for about 5 days. Her chiropractor had seen her today and recommended that she get a right lower extremity ultrasound to rule out DVT. She denies having any chest pain, no shortness of breath, no fever, no chills, no headache, no palpitations, no diaphoresis, no abdominal pain, no changes in her bowel habits, no blurred vision, no focal deficit. She has not had any recent surgery and denies recent travel. She has a family history significant for venous thromboembolic disease.   Past Medical History:  Diagnosis Date   Anxiety     Past Surgical History:  Procedure Laterality Date   VAGINAL HYSTERECTOMY      Allergies  Allergen Reactions   Erythromycin Nausea Only    Prior to Admission medications   Medication Sig Start Date End Date Taking? Authorizing Provider  amoxicillin (AMOXIL) 500 MG capsule Take 500 mg by mouth 2 (two) times daily. 11/13/22  Yes [provider]  hydrochlorothiazide (HYDRODIURIL) 25 MG tablet Take 25 mg by mouth daily as needed. 11/13/22  Yes [provider]  ivermectin (STROMECTOL) 3 MG TABS tablet Take 5 tablets by mouth daily. 11/13/22  Yes [provider]  Vitamin D, Ergocalciferol, (DRISDOL) 1.25 MG (50000 UNIT) CAPS capsule Take 50,000 Units by mouth every 7 (seven) days. 11/13/22  Yes [provider]  ALPRAZolam (XANAX) 0.25 MG tablet Take 1 tablet (0.25 mg total) by mouth at bedtime as needed for anxiety. 05/04/18   Juline Patch, MD  dicyclomine (BENTYL) 10 MG capsule Take 1 capsule by mouth 2 (two) times daily. Reported on 01/21/2016 Patient not taking:  Reported on 02/22/2021 12/01/14   [provider]  ibuprofen (ADVIL,MOTRIN) 800 MG tablet Take 1 tablet (800 mg total) by mouth 3 (three) times daily. Patient not taking: Reported on 05/04/2018 11/19/15   Juline Patch, MD  mupirocin ointment (BACTROBAN) 2 % Apply 1 application topically 2 (two) times daily. Patient not taking: Reported on 11/17/2022 02/22/21   Juline Patch, MD  ondansetron (ZOFRAN) 8 MG tablet Take 4 mg by mouth 2 (two) times daily as needed.    [provider]    Social History   Socioeconomic History   Marital status: Married    Spouse name: Not on file   Number of children: Not on file   Years of education: Not on file   Highest education level: Not on file  Occupational History   Not on file  Tobacco Use   Smoking status: Never   Smokeless tobacco: Never  Substance and Sexual Activity   Alcohol use: Yes    Alcohol/week: 0.0 standard drinks of alcohol   Drug use: No   Sexual activity: Not Currently  Other Topics Concern   Not on file  Social History Narrative   Not on file   Social Determinants of Health   Financial Resource Strain: Not on file  Food Insecurity: No Food Insecurity (11/18/2022)   Hunger Vital Sign    Worried About Running Out of Food in the Last Year: Never true    Ran Out of Food in the Last Year: Never true  Transportation Needs: No  Transportation Needs (11/18/2022)   PRAPARE - Hydrologist (Medical): No    Lack of Transportation (Non-Medical): No  Physical Activity: Not on file  Stress: Not on file  Social Connections: Not on file  Intimate Partner Violence: Not At Risk (11/18/2022)   Humiliation, Afraid, Rape, and Kick questionnaire    Fear of Current or Ex-Partner: No    Emotionally Abused: No    Physically Abused: No    Sexually Abused: No     History reviewed. No pertinent family history.  ROS: Otherwise negative unless mentioned in HPI  Physical Examination  Vitals:    11/18/22 0215 11/18/22 0440  BP: 137/76 133/74  Pulse: 94 92  Resp: 20 20  Temp: 98 F (36.7 C) 98.4 F (36.9 C)  SpO2: 100% 97%   Body mass index is 30.3 kg/m.  General:  WDWN in NAD Gait: Not observed HENT: WNL, normocephalic Pulmonary: normal non-labored breathing, without Rales, rhonchi,  wheezing Cardiac: regular, without  Murmurs, rubs or gallops; without carotid bruits Abdomen: Positive Bowel Sounds, soft, NT/ND, no masses Skin: without rashes Vascular Exam/Pulses: Palpable bilateral lower extremity pulses.  Extremities: without ischemic changes, without Gangrene , without cellulitis; without open wounds;  Musculoskeletal: no muscle wasting or atrophy  Neurologic: A&O X 3;  No focal weakness or paresthesias are detected; speech is fluent/normal Psychiatric:  The pt has Normal affect. Lymph:  Unremarkable  CBC    Component Value Date/Time   WBC 10.4 11/18/2022 0433   RBC 4.35 11/18/2022 0433   HGB 13.2 11/18/2022 0433   HCT 38.3 11/18/2022 0433   PLT 257 11/18/2022 0433   MCV 88.0 11/18/2022 0433   MCH 30.3 11/18/2022 0433   MCHC 34.5 11/18/2022 0433   RDW 12.3 11/18/2022 0433   LYMPHSABS 2.3 11/17/2022 2048   MONOABS 0.9 11/17/2022 2048   EOSABS 0.2 11/17/2022 2048   BASOSABS 0.1 11/17/2022 2048    BMET    Component Value Date/Time   NA 140 11/18/2022 0433   K 3.3 (L) 11/18/2022 0433   CL 105 11/18/2022 0433   CO2 27 11/18/2022 0433   GLUCOSE 121 (H) 11/18/2022 0433   BUN 15 11/18/2022 0433   BUN 14 02/27/2015 0000   CREATININE 0.74 11/18/2022 0433   CALCIUM 9.3 11/18/2022 0433   GFRNONAA >60 11/18/2022 0433    COAGS: Lab Results  Component Value Date   INR 1.0 11/17/2022     Non-Invasive Vascular Imaging:   EXAM: RIGHT LOWER EXTREMITY VENOUS DOPPLER ULTRASOUND  IMPRESSION: Positive for deep venous thrombosis involving the right femoral, popliteal and calf veins.  Statin:  No. Beta Blocker:  No. Aspirin:  No. ACEI:  No. ARB:   No. CCB use:  No Other antiplatelets/anticoagulants:  No.    ASSESSMENT/PLAN: This is a 62 y.o. female who presented to the [facility] emergency department from her chiropractor's office for right lower extremity ultrasound to evaluate for right lower extremity swelling over the past 5 days.  Right lower extremity venous Doppler ultrasound did show positive for a deep venous thrombosis involving the right femoral popliteal and calf veins.  She was placed on heparin after being admitted.  He states this morning that her right leg swelling has almost completely subsided.  She does not have any more pain in her right leg.  Plan:  Vascular surgery at this time does not feel she would benefit from a right lower extremity thrombectomy.  She be started on Eliquis 5 mg twice a  day.  And after she receives her first dose today she can go home.  Heparin drip will be stopped.  She was instructed to make sure that she elevates her legs in between long periods of standing.  She was also advised to wear graduated compression stockings during the day.  Patient will be scheduled for follow-up in 4 to 6 weeks with vascular ultrasound of her right lower extremity.   -Discussed the plan with Dr. Ella Jubilee.  He is in agreement with the plan   Drema Pry [facility] 11/18/2022 7:22 AM

## 2022-11-18 NOTE — Discharge Summary (Signed)
Physician Discharge Summary   Patient: Sylvia Boyer MRN: [id number] DOB: April 18, 1961  Admit date:     11/17/2022  Discharge date: 11/18/22  Discharge Physician: Lorella Nimrod   PCP: Juline Patch, MD   Recommendations at discharge:  Please obtain CBC and BMP in 1 week Follow-up with primary care provider Follow-up with vascular surgery  Discharge Diagnoses: Principal Problem:   DVT of deep femoral vein, right [facility]) Active Problems:   Hypokalemia   Anxiety   Hospital Course: Taken from H&P.  : DHANI CICHOSZ is a 62 y.o. female with medical history significant for anxiety disorder who was sent to the ER from her chiropractor's office for right lower extremity ultrasound to evaluate right lower extremity swelling for about 5 days. Patient states that she is usually very active but over the last 1 month she had been taking care of her dog who had surgery.  She states that she has been sitting for long hours carrying this dog in her lap and about 5 days ago she noted some swelling involving her right leg associated with pain which had progressively worsened.   Right lower extremity venous Doppler is positive for deep venous thrombosis involving the right femoral, popliteal and calf veins. She was started on a heparin drip and will be referred to observation status for further evaluation.  2/13: Vital stable.  Some improvement in hypokalemia but remained low at 3.3.  Magnesium at 1.8.  Replating potassium.  Heparin was switched with Eliquis.  Patient will take 10 mg twice daily for first week followed by 5 mg twice daily.  She will follow-up with vascular surgery and they can determine the duration.  She was given prescription for total of 24-month Her right lower extremity edema improving, having very mild right calf tightness but no tenderness.  She will continue with rest of her home medications and need to have a close follow-up with her providers for further  recommendations.    Assessment and Plan: * DVT of deep femoral vein, right (HCC) Patient presents to the ER for evaluation of right leg swelling and pain and had right lower extremity venous Doppler which was positive for deep venous thrombosis involving the right femoral, popliteal and calf veins. Continue heparin drip initiated in the ER Vascular surgery consult to evaluate for possible thrombectomy  Hypokalemia Related to hydrochlorothiazide use Supplement potassium  Anxiety Continue as needed Xanax   Consultants: Vascular surgery Procedures performed: None Disposition: Home Diet recommendation:  Discharge Diet Orders (From admission, onward)     Start     Ordered   11/18/22 0000  Diet - low sodium heart healthy        11/18/22 1037           Cardiac diet DISCHARGE MEDICATION: Allergies as of 11/18/2022       Reactions   Erythromycin Nausea Only        Medication List     STOP taking these medications    dicyclomine 10 MG capsule Commonly known as: BENTYL   ibuprofen 800 MG tablet Commonly known as: ADVIL   mupirocin ointment 2 % Commonly known as: BACTROBAN       TAKE these medications    ALPRAZolam 0.25 MG tablet Commonly known as: XANAX Take 1 tablet (0.25 mg total) by mouth at bedtime as needed for anxiety.   amoxicillin 500 MG capsule Commonly known as: AMOXIL Take 500 mg by mouth 2 (two) times daily.   Apixaban Starter Pack (181m  and 54m) Commonly known as: ELIQUIS STARTER PACK Take as directed on package: start with two-567mtablets twice daily for 7 days. On day 8, switch to one-20m34mablet twice daily.   apixaban 5 MG Tabs tablet Commonly known as: ELIQUIS Take 1 tablet (5 mg total) by mouth 2 (two) times daily. Start after completing starter pack Start taking on: November 25, 2022   hydrochlorothiazide 25 MG tablet Commonly known as: HYDRODIURIL Take 25 mg by mouth daily as needed.   ivermectin 3 MG Tabs tablet Commonly  known as: STROMECTOL Take 5 tablets by mouth daily.   ondansetron 8 MG tablet Commonly known as: ZOFRAN Take 4 mg by mouth 2 (two) times daily as needed.   Vitamin D (Ergocalciferol) 1.25 MG (50000 UNIT) Caps capsule Commonly known as: DRISDOL Take 50,000 Units by mouth every 7 (seven) days.        Follow-up Information     Schnier, GreDolores LoryD Follow up in 6 week(s).   Specialties: Vascular Surgery, Cardiology, Radiology, Vascular Surgery Why: Venous doppler ultrasound right lower extremity Post DVT Contact information: [phone]         JonJuline PatchD. Schedule an appointment as soon as possible for a visit in 1 week(s).   Specialty: Family Medicine Contact information: [phone]                Discharge Exam: FilDanley Dankerights   11/17/22 1746 11/18/22 0215  Weight: 89.4 kg 90.4 kg   General.     In no acute distress. Pulmonary.  Lungs clear bilaterally, normal respiratory effort. CV.  Regular rate and rhythm, no JVD, rub or murmur. Abdomen.  Soft, nontender, nondistended, BS positive. CNS.  Alert and oriented .  No focal neurologic deficit. Extremities.  Trace right lower extremity edema as compared to left with mild calf tightness, pulses intact and symmetrical. Psychiatry.  Judgment and insight appears normal.   Condition at discharge: stable  The results of significant diagnostics from this hospitalization (including imaging, microbiology, ancillary and laboratory) are listed below for reference.   Imaging Studies: US Koreanous Img Lower Unilateral Right (DVT)  Result Date: 11/17/2022 CLINICAL DATA:  Right lower extremity swelling for 5 days EXAM: RIGHT LOWER EXTREMITY VENOUS DOPPLER ULTRASOUND TECHNIQUE: Gray-scale sonography with graded compression, as well as color Doppler and duplex ultrasound were performed to evaluate the lower extremity  deep venous systems from the level of the common femoral vein and including the common femoral, femoral, profunda femoral, popliteal and calf veins including the posterior tibial, peroneal and gastrocnemius veins when visible. The superficial great saphenous vein was also interrogated. Spectral Doppler was utilized to evaluate flow at rest and with distal augmentation maneuvers in the common femoral, femoral and popliteal veins. COMPARISON:  None Available. FINDINGS: Contralateral Common Femoral Vein: Respiratory phasicity is normal and symmetric with the symptomatic side. No evidence of thrombus. Normal compressibility. Common Femoral Vein: No evidence of thrombus. Normal compressibility, respiratory phasicity and response to augmentation. Saphenofemoral Junction: No evidence of thrombus. Normal compressibility and flow on color Doppler imaging. Profunda Femoral Vein: No evidence of thrombus. Normal compressibility and flow on color Doppler imaging. Femoral Vein: Incomplete compression and poor blood flow on color Doppler Popliteal Vein: Incomplete compression and poor flow on color Doppler Calf Veins: Incomplete compression and poor flow on color Doppler Superficial Great Saphenous Vein: No evidence of thrombus. Normal compressibility. Venous Reflux:  None. Other Findings:  None. Critical Value/emergent results were called by telephone at the time of interpretation on 11/17/2022 at 6:16 pm to provider Dr. Starleen Blue, who verbally acknowledged these results. IMPRESSION: Positive for deep venous thrombosis involving the right femoral, popliteal and calf veins. Electronically Signed   By: Jill Side M.D.   On: 11/17/2022 18:16    Microbiology: Results for orders placed or performed in visit on 09/12/19  Novel Coronavirus, NAA ([facility])     Status: None   Collection Time: 09/12/19  8:57 AM   Specimen: Nasopharyngeal(NP) swabs in vial transport medium   NASOPHARYNGE  TESTING  Result Value Ref Range Status    SARS-CoV-2, NAA Not Detected Not Detected Final    Comment: This nucleic acid amplification test was developed and its performance characteristics determined by [employer]. Nucleic acid amplification tests include PCR and TMA. This test has not been FDA cleared or approved. This test has been authorized by FDA under an Emergency Use Authorization (EUA). This test is only authorized for the duration of time the declaration that circumstances exist justifying the authorization of the emergency use of in vitro diagnostic tests for detection of SARS-CoV-2 virus and/or diagnosis of COVID-19 infection under section 564(b)(1) of the Act, 21 U.S.C. [id number]) (1), unless the authorization is terminated or revoked sooner. When diagnostic testing is negative, the possibility of a false negative result should be considered in the context of a patient's recent exposures and the presence of clinical signs and symptoms consistent with COVID-19. An individual without symptoms of COVID-19 and who is not shedding SARS-CoV-2 virus would  expect to have a negative (not detected) result in this assay.     Labs: CBC: Recent Labs  Lab 11/17/22 2048 11/18/22 0433  WBC 11.9* 10.4  NEUTROABS 8.5*  --   HGB 14.3 13.2  HCT 40.9 38.3  MCV 88.1 88.0  PLT 257 [id number]   Basic Metabolic Panel: Recent Labs  Lab 11/17/22 2048 11/18/22 0433  NA 140 140  K 3.0* 3.3*  CL 102 105  CO2 25 27  GLUCOSE 109* 121*  BUN 13 15  CREATININE 0.71 0.74  CALCIUM 9.7 9.3  MG  --  1.8   Liver Function Tests: Recent Labs  Lab 11/17/22 2048  AST 22  ALT 21  ALKPHOS 62  BILITOT 0.8  PROT 7.5  ALBUMIN 4.1   CBG: No results for input(s): "GLUCAP" in the last 168 hours.  Discharge time spent: greater than 30 minutes.  This record has been created using Systems analyst. Errors have been sought and corrected,but may not always be located. Such creation errors do not reflect on the  standard of care.   Signed: Lorella Nimrod, MD [facility] 11/18/2022

## 2022-11-19 ENCOUNTER — Telehealth: Payer: Self-pay

## 2022-11-19 NOTE — Transitions of Care (Post Inpatient/ED Visit) (Signed)
   11/19/2022  Name: Sylvia Boyer MRN: [id number] DOB: [birth year]  Today's TOC FU Call Status: Today's TOC FU Call Status:: Successful TOC FU Call Competed TOC FU Call Complete Date: 11/19/22  Transition Care Management Follow-up Telephone Call Date of Discharge: 11/18/22 Discharge Facility: [facility] Type of Discharge: Inpatient Admission Primary Inpatient Discharge Diagnosis:: embolism How have you been since you were released from the hospital?: Better Any questions or concerns?: No  Items Reviewed: Did you receive and understand the discharge instructions provided?: Yes Medications obtained and verified?: Yes (Medications Reviewed) Any new allergies since your discharge?: No Dietary orders reviewed?: NA Do you have support at home?: Yes People in Home: spouse  Home Care and Equipment/Supplies: [place] Ordered?: NA Any new equipment or medical supplies ordered?: NA  Functional Questionnaire: Do you need assistance with bathing/showering or dressing?: No Do you need assistance with meal preparation?: No Do you need assistance with eating?: No Do you have difficulty maintaining continence: No Do you need assistance with getting out of bed/getting out of a chair/moving?: No Do you have difficulty managing or taking your medications?: No  Folllow up appointments reviewed: PCP Follow-up appointment confirmed?: Yes Date of PCP follow-up appointment?: 11/27/22 Follow-up Provider: Dr Ronnald Ramp [facility] Follow-up appointment confirmed?: Yes Date of Specialist follow-up appointment?: 12/31/22 Follow-Up Specialty Provider:: Vascular and Vein Do you need transportation to your follow-up appointment?: No Do you understand care options if your condition(s) worsen?: Yes-patient verbalized understanding    [place], [place] Direct Dial [phone]

## 2022-11-19 NOTE — Telephone Encounter (Signed)
TOC already done

## 2022-11-21 ENCOUNTER — Ambulatory Visit: Payer: BC Managed Care – PPO | Admitting: Family Medicine

## 2022-11-28 ENCOUNTER — Ambulatory Visit: Payer: BC Managed Care – PPO | Admitting: Family Medicine

## 2022-11-28 ENCOUNTER — Encounter: Payer: Self-pay | Admitting: Family Medicine

## 2022-11-28 VITALS — BP 134/82 | HR 106 | Ht 68.0 in | Wt 202.0 lb

## 2022-11-28 DIAGNOSIS — Q752 Hypertelorism: Secondary | ICD-10-CM

## 2022-11-28 DIAGNOSIS — I10 Essential (primary) hypertension: Secondary | ICD-10-CM

## 2022-11-28 DIAGNOSIS — I82411 Acute embolism and thrombosis of right femoral vein: Secondary | ICD-10-CM | POA: Diagnosis not present

## 2022-11-28 DIAGNOSIS — E876 Hypokalemia: Secondary | ICD-10-CM

## 2022-11-28 MED ORDER — HYDROCHLOROTHIAZIDE 12.5 MG PO TABS: 12.5000 mg | ORAL_TABLET | Freq: Every day | ORAL | 0 refills | Status: DC

## 2022-11-28 NOTE — Patient Instructions (Addendum)
Managing Your Hypertension Hypertension, also called high blood pressure, is when the force of the blood pressing against the walls of the arteries is too strong. Arteries are blood vessels that carry blood from your heart throughout your body. Hypertension forces the heart to work harder to pump blood and may cause the arteries to become narrow or stiff. Understanding blood pressure readings A blood pressure reading includes a higher number over a lower number: The first, or top, number is called the systolic pressure. It is a measure of the pressure in your arteries as your heart beats. The second, or bottom number, is called the diastolic pressure. It is a measure of the pressure in your arteries as the heart relaxes. For most people, a normal blood pressure is below 120/80. Your personal target blood pressure may vary depending on your medical conditions, your age, and other factors. Blood pressure is classified into four stages. Based on your blood pressure reading, your health care provider may use the following stages to determine what type of treatment you need, if any. Systolic pressure and diastolic pressure are measured in a unit called millimeters of mercury (mmHg). Normal Systolic pressure: below 120. Diastolic pressure: below 80. Elevated Systolic pressure: 120-129. Diastolic pressure: below 80. Hypertension stage 1 Systolic pressure: 130-139. Diastolic pressure: 80-89. Hypertension stage 2 Systolic pressure: 140 or above. Diastolic pressure: 90 or above. How can this condition affect me? Managing your hypertension is very important. Over time, hypertension can damage the arteries and decrease blood flow to parts of the body, including the brain, heart, and kidneys. Having untreated or uncontrolled hypertension can lead to: A heart attack. A stroke. A weakened blood vessel (aneurysm). Heart failure. Kidney damage. Eye damage. Memory and concentration problems. Vascular  dementia. What actions can I take to manage this condition? Hypertension can be managed by making lifestyle changes and possibly by taking medicines. Your health care provider will help you make a plan to bring your blood pressure within a normal range. You may be referred for counseling on a healthy diet and physical activity. Nutrition  Eat a diet that is high in fiber and potassium, and low in salt (sodium), added sugar, and fat. An example eating plan is called the DASH diet. DASH stands for Dietary Approaches to Stop Hypertension. To eat this way: Eat plenty of fresh fruits and vegetables. Try to fill one-half of your plate at each meal with fruits and vegetables. Eat whole grains, such as whole-wheat pasta, brown rice, or whole-grain bread. Fill about one-fourth of your plate with whole grains. Eat low-fat dairy products. Avoid fatty cuts of meat, processed or cured meats, and poultry with skin. Fill about one-fourth of your plate with lean proteins such as fish, chicken without skin, beans, eggs, and tofu. Avoid pre-made and processed foods. These tend to be higher in sodium, added sugar, and fat. Reduce your daily sodium intake. Many people with hypertension should eat less than 1,500 mg of sodium a day. Lifestyle  Work with your health care provider to maintain a healthy body weight or to lose weight. Ask what an ideal weight is for you. Get at least 30 minutes of exercise that causes your heart to beat faster (aerobic exercise) most days of the week. Activities may include walking, swimming, or biking. Include exercise to strengthen your muscles (resistance exercise), such as weight lifting, as part of your weekly exercise routine. Try to do these types of exercises for 30 minutes at least 3 days a week. Do   not use any products that contain nicotine or tobacco. These products include cigarettes, chewing tobacco, and vaping devices, such as e-cigarettes. If you need help quitting, ask your  health care provider. Control any long-term (chronic) conditions you have, such as high cholesterol or diabetes. Identify your sources of stress and find ways to manage stress. This may include meditation, deep breathing, or making time for fun activities. Alcohol use Do not drink alcohol if: Your health care provider tells you not to drink. You are pregnant, may be pregnant, or are planning to become pregnant. If you drink alcohol: Limit how much you have to: 0-1 drink a day for women. 0-2 drinks a day for men. Know how much alcohol is in your drink. In the [place] one drink equals one 12 oz bottle of beer (355 mL), one 5 oz glass of wine (148 mL), or one 1 oz glass of hard liquor (44 mL). Medicines Your health care provider may prescribe medicine if lifestyle changes are not enough to get your blood pressure under control and if: Your systolic blood pressure is 130 or higher. Your diastolic blood pressure is 80 or higher. Take medicines only as told by your health care provider. Follow the directions carefully. Blood pressure medicines must be taken as told by your health care provider. The medicine does not work as well when you skip doses. Skipping doses also puts you at risk for problems. Monitoring Before you monitor your blood pressure: Do not smoke, drink caffeinated beverages, or exercise within 30 minutes before taking a measurement. Use the bathroom and empty your bladder (urinate). Sit quietly for at least 5 minutes before taking measurements. Monitor your blood pressure at home as told by your health care provider. To do this: Sit with your back straight and supported. Place your feet flat on the floor. Do not cross your legs. Support your arm on a flat surface, such as a table. Make sure your upper arm is at heart level. Each time you measure, take two or three readings one minute apart and record the results. You may also need to have your blood pressure checked regularly by  your health care provider. General information Talk with your health care provider about your diet, exercise habits, and other lifestyle factors that may be contributing to hypertension. Review all the medicines you take with your health care provider because there may be side effects or interactions. Keep all follow-up visits. Your health care provider can help you create and adjust your plan for managing your high blood pressure. Where to find more information National Heart, Lung, and Blood Institute: www.nhlbi.nih.gov American Heart Association: www.heart.org Contact a health care provider if: You think you are having a reaction to medicines you have taken. You have repeated (recurrent) headaches. You feel dizzy. You have swelling in your ankles. You have trouble with your vision. Get help right away if: You develop a severe headache or confusion. You have unusual weakness or numbness, or you feel faint. You have severe pain in your chest or abdomen. You vomit repeatedly. You have trouble breathing. These symptoms may be an emergency. Get help right away. Call 911. Do not wait to see if the symptoms will go away. Do not drive yourself to the hospital. Summary Hypertension is when the force of blood pumping through your arteries is too strong. If this condition is not controlled, it may put you at risk for serious complications. Your personal target blood pressure may vary depending on your medical conditions,   your age, and other factors. For most people, a normal blood pressure is less than 120/80. Hypertension is managed by lifestyle changes, medicines, or both. Lifestyle changes to help manage hypertension include losing weight, eating a healthy, low-sodium diet, exercising more, stopping smoking, and limiting alcohol. This information is not intended to replace advice given to you by your health care provider. Make sure you discuss any questions you have with your health care  provider. Document Revised: 06/06/2021 Document Reviewed: 06/06/2021 Elsevier Patient Education  [place] refers to food and lifestyle choices that are based on the traditions of countries located on the [employer]. It focuses on eating more fruits, vegetables, whole grains, beans, nuts, seeds, and heart-healthy fats, and eating less dairy, meat, eggs, and processed foods with added sugar, salt, and fat. This way of eating has been shown to help prevent certain conditions and improve outcomes for people who have chronic diseases, like kidney disease and heart disease. What are tips for following this plan? Reading food labels Check the serving size of packaged foods. For foods such as rice and pasta, the serving size refers to the amount of cooked product, not dry. Check the total fat in packaged foods. Avoid foods that have saturated fat or trans fats. Check the ingredient list for added sugars, such as corn syrup. Shopping  Buy a variety of foods that offer a balanced diet, including: Fresh fruits and vegetables (produce). Grains, beans, nuts, and seeds. Some of these may be available in unpackaged forms or large amounts (in bulk). Fresh seafood. Poultry and eggs. Low-fat dairy products. Buy whole ingredients instead of prepackaged foods. Buy fresh fruits and vegetables in-season from local farmers markets. Buy plain frozen fruits and vegetables. If you do not have access to quality fresh seafood, buy precooked frozen shrimp or canned fish, such as tuna, salmon, or sardines. Stock your pantry so you always have certain foods on hand, such as olive oil, canned tuna, canned tomatoes, rice, pasta, and beans. Cooking Cook foods with extra-virgin olive oil instead of using butter or other vegetable oils. Have meat as a side dish, and have vegetables or grains as your main dish. This means having meat in small portions or adding small  amounts of meat to foods like pasta or stew. Use beans or vegetables instead of meat in common dishes like chili or lasagna. Experiment with different cooking methods. Try roasting, broiling, steaming, and sauting vegetables. Add frozen vegetables to soups, stews, pasta, or rice. Add nuts or seeds for added healthy fats and plant protein at each meal. You can add these to yogurt, salads, or vegetable dishes. Marinate fish or vegetables using olive oil, lemon juice, garlic, and fresh herbs. Meal planning Plan to eat one vegetarian meal one day each week. Try to work up to two vegetarian meals, if possible. Eat seafood two or more times a week. Have healthy snacks readily available, such as: Vegetable sticks with hummus. Greek yogurt. Fruit and nut trail mix. Eat balanced meals throughout the week. This includes: Fruit: 2-3 servings a day. Vegetables: 4-5 servings a day. Low-fat dairy: 2 servings a day. Fish, poultry, or lean meat: 1 serving a day. Beans and legumes: 2 or more servings a week. Nuts and seeds: 1-2 servings a day. Whole grains: 6-8 servings a day. Extra-virgin olive oil: 3-4 servings a day. Limit red meat and sweets to only a few servings a month. Lifestyle  Cook and eat meals together with your  family, when possible. Drink enough fluid to keep your urine pale yellow. Be physically active every day. This includes: Aerobic exercise like running or swimming. Leisure activities like gardening, walking, or housework. Get 7-8 hours of sleep each night. If recommended by your health care provider, drink red wine in moderation. This means 1 glass a day for nonpregnant women and 2 glasses a day for men. A glass of wine equals 5 oz (150 mL). What foods should I eat? Fruits Apples. Apricots. Avocado. Berries. Bananas. Cherries. Dates. Figs. Grapes. Lemons. Melon. Oranges. Peaches. Plums. Pomegranate. Vegetables Artichokes. Beets. Broccoli. Cabbage. Carrots. Eggplant. Green  beans. Chard. Kale. Spinach. Onions. Leeks. Peas. Squash. Tomatoes. Peppers. Radishes. Grains Whole-grain pasta. Brown rice. Bulgur wheat. Polenta. Couscous. Whole-wheat bread. [place]. Meats and other proteins Beans. Almonds. Sunflower seeds. Pine nuts. Peanuts. Humptulips. Salmon. Scallops. Shrimp. [place]. Tilapia. Clams. Oysters. Eggs. Poultry without skin. Dairy Low-fat milk. Cheese. Greek yogurt. Fats and oils Extra-virgin olive oil. Avocado oil. Grapeseed oil. Beverages Water. Red wine. Herbal tea. Sweets and desserts Greek yogurt with honey. Baked apples. Poached pears. Trail mix. Seasonings and condiments Basil. Cilantro. Coriander. Cumin. Mint. Parsley. Sage. Rosemary. Tarragon. Garlic. Oregano. Thyme. Pepper. Balsamic vinegar. Tahini. Hummus. Tomato sauce. Olives. Mushrooms. The items listed above may not be a complete list of foods and beverages you can eat. Contact a dietitian for more information. What foods should I limit? This is a list of foods that should be eaten rarely or only on special occasions. Fruits Fruit canned in syrup. Vegetables Deep-fried potatoes (french fries). Grains Prepackaged pasta or rice dishes. Prepackaged cereal with added sugar. Prepackaged snacks with added sugar. Meats and other proteins Beef. Pork. Lamb. Poultry with skin. Hot dogs. [place] Salines. Dairy Ice cream. Sour cream. Whole milk. Fats and oils Butter. Canola oil. Vegetable oil. Beef fat (tallow). Lard. Beverages Juice. Sugar-sweetened soft drinks. Beer. Liquor and spirits. Sweets and desserts Cookies. Cakes. Pies. Candy. Seasonings and condiments Mayonnaise. Pre-made sauces and marinades. The items listed above may not be a complete list of foods and beverages you should limit. Contact a dietitian for more information. Summary The Mediterranean diet includes both food and lifestyle choices. Eat a variety of fresh fruits and vegetables, beans, nuts, seeds, and whole grains. Limit the  amount of red meat and sweets that you eat. If recommended by your health care provider, drink red wine in moderation. This means 1 glass a day for nonpregnant women and 2 glasses a day for men. A glass of wine equals 5 oz (150 mL). This information is not intended to replace advice given to you by your health care provider. Make sure you discuss any questions you have with your health care provider. Document Revised: 10/28/2019 Document Reviewed: 08/25/2019 Elsevier Patient Education  [place].

## 2022-11-28 NOTE — Progress Notes (Signed)
Date:  11/28/2022   Name:  Sylvia Boyer   DOB:  [birth year]   MRN:  [id number]   Chief Complaint: Hospitalization Follow-up (Admit for DVT on 11/17/22 and D/C on 11/18/22- TOC placed on 11/19/22)  Follow up Hospitalization  Patient was admitted to [facility] on 11/17/22 and discharged on 11/18/22. She was treated for DVT. Treatment for this included eliquist. Telephone follow up was done on 11/19/22 She reports excellent compliance with treatment. She reports this condition is improved.  ----------------------------------------------------------------------------------------- -     [facility] AT [facility] Discharge Acute Issues Care Follow Up                                                                        Patient Demographics  Sylvia Boyer, is a 62 y.o. female  DOB 07/18/1961  MRN [id number].  Primary MD  Juline Patch, MD   Reason for TCC follow Up -follow-up for reevaluation of tolerance of Eliquis and blood pressure check and recheck hypokalemia.   Past Medical History:  Diagnosis Date   Anxiety     Past Surgical History:  Procedure Laterality Date   VAGINAL HYSTERECTOMY     .Recent HPI and Hosp Course recent HPI is doing well without issues taking the Eliquis at current dosing.  Patient seems to think that there is been significant decrease in swelling of her right leg but there is still some relative increase in that is 2 cm more than on the left.  Patient would like to increase walking better send no more than 30 minutes in the morning and 30 minutes in the afternoon at a slow pace.  [facility] Followup  Patient had not wanted to restart her hydrochlorothiazide however when indicated that she needed to be on this for her blood pressure she understands that it is more than just swelling  but is for blood pressure control and she will resume however we have decided to place it at a lower dosing 12.5 mg hydrochlorothiazide and that she developed hypokalemia on the 25 mg.  Subjective:   Sylvia Boyer today has, No headache, No chest pain, No abdominal pain - No Nausea, No new weakness tingling or numbness, No Cough - SOB.  No melena no hematochezia no abdominal discomfort tolerating medication well      Reason for frequent admissions/ER visits none      Objective:   Vitals:   11/28/22 1444 11/28/22 1446  BP: 138/86 134/82  Pulse: (!) 106   SpO2: 97%   Weight: 202 lb (91.6 kg)   Height: '5\' 8"'$  (1.727 m)     Wt Readings from Last 3 Encounters:  11/28/22 202 lb (91.6 kg)  11/18/22 199 lb 4.7 oz (90.4 kg)  02/22/21 197 lb (89.4 kg)    Allergies as of 11/28/2022       Reactions   Erythromycin Nausea Only  Medication List        Accurate as of November 28, 2022  3:26 PM. If you have any questions, ask your nurse or doctor.          ALPRAZolam 0.25 MG tablet Commonly known as: XANAX Take 1 tablet (0.25 mg total) by mouth at bedtime as needed for anxiety.   amoxicillin 500 MG capsule Commonly known as: AMOXIL Take 500 mg by mouth 2 (two) times daily.   Apixaban Starter Pack ('10mg'$  and '5mg'$ ) Commonly known as: ELIQUIS STARTER PACK Take as directed on package: start with two-'5mg'$  tablets twice daily for 7 days. On day 8, switch to one-'5mg'$  tablet twice daily.   apixaban 5 MG Tabs tablet Commonly known as: ELIQUIS Take 1 tablet (5 mg total) by mouth 2 (two) times daily. Start after completing starter pack   hydrochlorothiazide 12.5 MG tablet Commonly known as: HYDRODIURIL Take 1 tablet (12.5 mg total) by mouth daily. What changed:  medication strength how much to take when to take this reasons to take this Changed by: Otilio Miu, MD   ivermectin 3 MG Tabs tablet Commonly known as: STROMECTOL Take 5 tablets by mouth daily. Pt taking  as needed   ondansetron 8 MG tablet Commonly known as: ZOFRAN Take 4 mg by mouth 2 (two) times daily as needed.   Vitamin D (Ergocalciferol) 1.25 MG (50000 UNIT) Caps capsule Commonly known as: DRISDOL Take 50,000 Units by mouth every 7 (seven) days.         Physical Exam: Constitutional: Patient appears well-developed and well-nourished. Not in obvious distress. HENT: Normocephalic, atraumatic, External right and left ear normal. Oropharynx is clear and moist.  Eyes: Conjunctivae and EOM are normal. PERRLA, no scleral icterus. Neck: Normal ROM. Neck supple. No JVD. No tracheal deviation. No thyromegaly. CVS: RRR, S1/S2 +, no murmurs, no gallops, no carotid bruit.  Pulmonary: Effort and breath sounds normal, no stridor, rhonchi, wheezes, rales.  Abdominal: Soft. BS +, no distension, tenderness, rebound or guarding.  Musculoskeletal: Normal range of motion. No edema and no tenderness.  Lymphadenopathy: No lymphadenopathy noted, cervical, inguinal or axillary Neuro: Alert. Normal reflexes, muscle tone coordination. No cranial nerve deficit. Skin: Skin is warm and dry. No rash noted. Not diaphoretic. No erythema. No pallor. Psychiatric: Normal mood and affect. Behavior, judgment, thought content normal.   Data Review   Micro Results No results found for this or any previous visit (from the past 240 hour(s)).   CBC No results for input(s): "WBC", "HGB", "HCT", "PLT", "MCV", "MCH", "MCHC", "RDW", "LYMPHSABS", "MONOABS", "EOSABS", "BASOSABS", "BANDABS" in the last 168 hours.  Invalid input(s): "NEUTRABS", "BANDSABD"  Chemistries  No results for input(s): "NA", "K", "CL", "CO2", "GLUCOSE", "BUN", "CREATININE", "CALCIUM", "MG", "AST", "ALT", "ALKPHOS", "BILITOT" in the last 168 hours.  Invalid input(s): "GFRCGP" ------------------------------------------------------------------------------------------------------------------ estimated creatinine clearance is 85.2 mL/min (by C-G  formula based on SCr of 0.74 mg/dL). ------------------------------------------------------------------------------------------------------------------ No results for input(s): "HGBA1C" in the last 72 hours. ------------------------------------------------------------------------------------------------------------------ No results for input(s): "CHOL", "HDL", "LDLCALC", "TRIG", "CHOLHDL", "LDLDIRECT" in the last 72 hours. ------------------------------------------------------------------------------------------------------------------ No results for input(s): "TSH", "T4TOTAL", "T3FREE", "THYROIDAB" in the last 72 hours.  Invalid input(s): "FREET3" ------------------------------------------------------------------------------------------------------------------ No results for input(s): "VITAMINB12", "FOLATE", "FERRITIN", "TIBC", "IRON", "RETICCTPCT" in the last 72 hours.  Coagulation profile No results for input(s): "INR", "PROTIME" in the last 168 hours.  No results for input(s): "DDIMER" in the last 72 hours.  Cardiac Enzymes No results for input(s): "CKMB", "TROPONINI", "MYOGLOBIN" in the last 168 hours.  Invalid input(s): "  CK" ------------------------------------------------------------------------------------------------------------------ Invalid input(s): "POCBNP" Time spent 30 min     Otilio Miu M.D on 11/28/2022 at 3:26 PM   Disclaimer: This note may have been dictated with voice recognition software. Similar sounding words can inadvertently be transcribed and this note may contain transcription errors which may not have been corrected upon publication of note.    Lab Results  Component Value Date   NA 140 11/18/2022   K 3.3 (L) 11/18/2022   CO2 27 11/18/2022   GLUCOSE 121 (H) 11/18/2022   BUN 15 11/18/2022   CREATININE 0.74 11/18/2022   CALCIUM 9.3 11/18/2022   GFRNONAA >60 11/18/2022   Lab Results  Component Value Date   CHOL 163 02/27/2015   HDL 63  02/27/2015   LDLCALC 87 02/27/2015   TRIG 64 02/27/2015   Lab Results  Component Value Date   TSH 2.03 02/27/2015   No results found for: "HGBA1C" Lab Results  Component Value Date   WBC 10.4 11/18/2022   HGB 13.2 11/18/2022   HCT 38.3 11/18/2022   MCV 88.0 11/18/2022   PLT 257 11/18/2022   Lab Results  Component Value Date   ALT 21 11/17/2022   AST 22 11/17/2022   ALKPHOS 62 11/17/2022   BILITOT 0.8 11/17/2022   No results found for: "25OHVITD2", "25OHVITD3", "VD25OH"   Review of Systems  Constitutional:  Negative for fatigue, fever and unexpected weight change.  HENT:  Negative for trouble swallowing.   Respiratory:  Negative for choking, chest tightness, shortness of breath and wheezing.   Cardiovascular:  Negative for chest pain, palpitations and leg swelling.  Gastrointestinal:  Negative for abdominal pain and blood in stool.  Genitourinary:  Negative for difficulty urinating, hematuria and vaginal bleeding.    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  Current Meds  Medication Sig   ALPRAZolam (XANAX) 0.25 MG tablet Take 1 tablet (0.25 mg total) by mouth at bedtime as needed for anxiety.   amoxicillin (AMOXIL) 500 MG capsule Take 500 mg by mouth 2 (two) times daily.   apixaban (ELIQUIS) 5 MG TABS tablet Take 1  tablet (5 mg total) by mouth 2 (two) times daily. Start after completing starter pack   APIXABAN (ELIQUIS) VTE STARTER PACK ('10MG'$  AND '5MG'$ ) Take as directed on package: start with two-'5mg'$  tablets twice daily for 7 days. On day 8, switch to one-'5mg'$  tablet twice daily.   ivermectin (STROMECTOL) 3 MG TABS tablet Take 5 tablets by mouth daily. Pt taking as needed   ondansetron (ZOFRAN) 8 MG tablet Take 4 mg by mouth 2 (two) times daily as needed.   Vitamin D, Ergocalciferol, (DRISDOL) 1.25 MG (50000 UNIT) CAPS capsule Take 50,000 Units by mouth every 7 (seven) days.       11/28/2022  2:49 PM 02/22/2021   10:38 AM  GAD 7 : Generalized Anxiety Score  Nervous, Anxious, on Edge 0 0  Control/stop worrying 0 0  Worry too much - different things 0 0  Trouble relaxing 0 0  Restless 0 0  Easily annoyed or irritable 0 0  Afraid - awful might happen 0 0  Total GAD 7 Score 0 0  Anxiety Difficulty Not difficult at all        11/28/2022    2:48 PM 02/22/2021   10:37 AM 08/12/2017   11:50 AM  Depression screen PHQ 2/9  Decreased Interest 0 0 0  Down, Depressed, Hopeless 0 0 0  PHQ - 2 Score 0 0 0  Altered sleeping 0 0 1  Tired, decreased energy 0 0 0  Change in appetite 0 0 0  Feeling bad or failure about yourself  0 0 0  Trouble concentrating 0 0 0  Moving slowly or fidgety/restless 0 0 0  Suicidal thoughts 0 0 0  PHQ-9 Score 0 0 1  Difficult doing work/chores Not difficult at all  Not difficult at all    BP Readings from Last 3 Encounters:  11/28/22 134/82  11/18/22 133/70  02/22/21 120/60    Physical Exam Vitals and nursing note reviewed.  HENT:     Right Ear: Tympanic membrane normal.     Left Ear: Tympanic membrane normal.     Nose: Nose normal. No congestion or rhinorrhea.     Mouth/Throat:     Mouth: Mucous membranes are moist.     Pharynx: No oropharyngeal exudate or posterior oropharyngeal erythema.  Eyes:     Pupils: Pupils are equal, round, and reactive to light.   Cardiovascular:     Rate and Rhythm: Normal rate and regular rhythm.     Heart sounds: No murmur heard.    No friction rub. No gallop.     Comments: Left calf measurement 36 cm right calf measurement 38 cm. Pulmonary:     Breath sounds: No wheezing, rhonchi or rales.  Chest:     Chest wall: No tenderness.  Abdominal:     Tenderness: There is no abdominal tenderness.  Musculoskeletal:     Cervical back: Normal range of motion.     Wt Readings from Last 3 Encounters:  11/28/22 202 lb (91.6 kg)  11/18/22 199 lb 4.7 oz (90.4 kg)  02/22/21 197 lb (89.4 kg)    BP 134/82 (BP Location: Right Arm, Cuff Size: Large)   Pulse (!) 106   Ht '5\' 8"'$  (1.727 m)   Wt 202 lb (91.6 kg)   SpO2 97%   BMI 30.71 kg/m   Assessment and Plan: 1. DVT of deep femoral vein, right (HCC) Follow-up from hospitalization for DVT due to relative inactivity.  Patient is currently taking Eliquis 5 mg twice a day and tolerating well.  Patient has not related any evidence of bleeding including hematuria hematochezia or melena.  Patient has an upcoming appointment with vein and vascular at which time it will be discussed the duration of therapy.  2. Hypertelorism Chronic.  Controlled.  Stable.  Continue hydrochlorothiazide 12.5 mg once a day. - hydrochlorothiazide (HYDRODIURIL) 12.5 MG tablet; Take 1 tablet (12.5 mg total) by mouth daily.  Dispense: 90 tablet; Refill: 0  3. Hypokalemia In the event the patient had a low potassium on 25 mg hydrochlorothiazide from previous start we will back down to 12.5 mg once a day and will recheck blood pressure in 4 weeks.  4 primary hypertension chronic.  Uncontrolled.  Stable.  Patient will resume hydrochlorothiazide which he has stopped on her own since blood pressure is elevated.  We will restart at a lower dosing hydrochlorothiazide 12.5 mg daily and will recheck in 4 weeks with electrolytes and labs.  Otilio Miu, MD

## 2022-12-01 NOTE — Addendum Note (Signed)
Addended by: Juline Patch on: 12/01/2022 04:48 PM   Modules accepted: Level of Service

## 2022-12-25 ENCOUNTER — Other Ambulatory Visit (INDEPENDENT_AMBULATORY_CARE_PROVIDER_SITE_OTHER): Payer: Self-pay | Admitting: Nurse Practitioner

## 2022-12-25 DIAGNOSIS — I82411 Acute embolism and thrombosis of right femoral vein: Secondary | ICD-10-CM

## 2022-12-31 ENCOUNTER — Ambulatory Visit (INDEPENDENT_AMBULATORY_CARE_PROVIDER_SITE_OTHER): Payer: BC Managed Care – PPO | Admitting: Nurse Practitioner

## 2022-12-31 ENCOUNTER — Encounter (INDEPENDENT_AMBULATORY_CARE_PROVIDER_SITE_OTHER): Payer: Self-pay | Admitting: Nurse Practitioner

## 2022-12-31 ENCOUNTER — Ambulatory Visit (INDEPENDENT_AMBULATORY_CARE_PROVIDER_SITE_OTHER): Payer: BC Managed Care – PPO

## 2022-12-31 VITALS — BP 105/72 | HR 101 | Resp 18 | Ht 68.0 in | Wt 204.8 lb

## 2022-12-31 DIAGNOSIS — I82411 Acute embolism and thrombosis of right femoral vein: Secondary | ICD-10-CM

## 2023-01-20 ENCOUNTER — Encounter (INDEPENDENT_AMBULATORY_CARE_PROVIDER_SITE_OTHER): Payer: Self-pay | Admitting: Nurse Practitioner

## 2023-01-20 NOTE — Progress Notes (Signed)
Subjective:    Patient ID: Sylvia Boyer, female    DOB: Mar 18, 1961, 62 y.o.   MRN: [id number] Chief Complaint  Patient presents with   Follow-up    6 week follow up with DVT    Sertraline is a 61 year old female who presents today for evaluation of right lower extremity DVT.  The swelling began suddenly and she was found to have a right lower extremity DVT.  The patient was started on heparin and transition to Eliquis in the hospital.  Based on the location of the DVT no thrombectomy was done at that time.  The patient notes that she has been doing well in consideration with the swelling.  There is been no development of any open wounds or ulcerations.  She is also currently been tolerating the Eliquis without significant issue.  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  These findings are consistent with what was noticed in the hospital.  No evidence of DVT in the left.    Review of Systems  Cardiovascular:  Positive for leg swelling.  All other systems reviewed and are negative.      Objective:   Physical Exam Vitals reviewed.  HENT:     Head: Normocephalic.  Cardiovascular:     Rate and Rhythm: Normal rate.     Pulses: Normal pulses.  Pulmonary:     Effort: Pulmonary effort is normal.  Skin:    General: Skin is warm and dry.  Neurological:     Mental Status: She is alert and oriented to person, place, and time.  Psychiatric:        Mood and Affect: Mood normal.        Behavior: Behavior normal.        Thought Content: Thought content normal.        Judgment: Judgment normal.     BP 105/72 (BP Location: Right Arm)   Pulse (!) 101   Resp 18   Ht  (1.727 m)   Wt 204 lb 12.8 oz (92.9 kg)   BMI 31.14 kg/m   Past Medical History:  Diagnosis Date   Anxiety     Social History   Socioeconomic History   Marital status: Married    Spouse name: Not on file   Number of  children: Not on file   Years of education: Not on file   Highest education level: Not on file  Occupational History   Not on file  Tobacco Use   Smoking status: Never   Smokeless tobacco: Never  Substance and Sexual Activity   Alcohol use: Yes    Alcohol/week: 0.0 standard drinks of alcohol   Drug use: No   Sexual activity: Not Currently  Other Topics Concern   Not on file  Social History Narrative   Not on file   Social Determinants of Health   Financial Resource Strain: Not on file  Food Insecurity: No Food Insecurity (11/18/2022)   Hunger Vital Sign    Worried About Running Out of Food in the Last Year: Never true    Ran Out of Food in the Last Year: Never true  Transportation Needs: No Transportation Needs (11/18/2022)   PRAPARE - Administrator, Civil Service (Medical): No    Lack of Transportation (Non-Medical): No  Physical Activity: Not on file  Stress: Not on file  Social Connections: Not on file  Intimate Partner Violence: Not At Risk (11/18/2022)  Humiliation, Afraid, Rape, and Kick questionnaire    Fear of Current or Ex-Partner: No    Emotionally Abused: No    Physically Abused: No    Sexually Abused: No    Past Surgical History:  Procedure Laterality Date   VAGINAL HYSTERECTOMY      History reviewed. No pertinent family history.  Allergies  Allergen Reactions   Erythromycin Nausea Only       Latest Ref Rng & Units 11/18/2022    4:33 AM 11/17/2022    8:48 PM 02/27/2015   12:00 AM  CBC  WBC 4.0 - 10.5 K/uL 10.4  11.9    Hemoglobin 12.0 - 15.0 g/dL 16.1  09.6  04.5   Hematocrit 36.0 - 46.0 % 38.3  40.9    Platelets 150 - 400 K/uL 257  257        CMP     Component Value Date/Time   NA 140 11/18/2022 0433   K 3.3 (L) 11/18/2022 0433   CL 105 11/18/2022 0433   CO2 27 11/18/2022 0433   GLUCOSE 121 (H) 11/18/2022 0433   BUN 15 11/18/2022 0433   BUN 14 02/27/2015 0000   CREATININE 0.74 11/18/2022 0433   CALCIUM 9.3 11/18/2022  0433   PROT 7.5 11/17/2022 2048   ALBUMIN 4.1 11/17/2022 2048   AST 22 11/17/2022 2048   ALT 21 11/17/2022 2048   ALKPHOS 62 11/17/2022 2048   BILITOT 0.8 11/17/2022 2048   GFRNONAA >60 11/18/2022 0433     No results found.     Assessment & Plan:   1. DVT of deep femoral vein, right (HCC) Today the patient symptoms are improved.  She did not undergo thrombectomy.  Today her ultrasound does continue to show some extensive DVT and a chronic state.  Her findings are essentially unchanged from the time during her hospitalization.  Based on this we will have the patient return in about 8 weeks to see if there is some improvement.  She will continue Eliquis during this time as there have not been any issues.   Current Outpatient Medications on File Prior to Visit  Medication Sig Dispense Refill   ALPRAZolam (XANAX) 0.25 MG tablet Take 1 tablet (0.25 mg total) by mouth at bedtime as needed for anxiety. 20 tablet 0   apixaban (ELIQUIS) 5 MG TABS tablet Take 1 tablet (5 mg total) by mouth 2 (two) times daily. Start after completing starter pack 60 tablet 1   APIXABAN (ELIQUIS) VTE STARTER PACK (  AND ) Take as directed on package: start with two-5mg  tablets twice daily for 7 days. On day 8, switch to one-5mg  tablet twice daily. 1 each 0   hydrochlorothiazide (HYDRODIURIL) 12.5 MG tablet Take 1 tablet (12.5 mg total) by mouth daily. 90 tablet 0   ondansetron (ZOFRAN) 8 MG tablet Take 4 mg by mouth 2 (two) times daily as needed.     Vitamin D, Ergocalciferol, (DRISDOL) 1.25 MG (50000 UNIT) CAPS capsule Take 50,000 Units by mouth every 7 (seven) days.     amoxicillin (AMOXIL) 500 MG capsule Take 500 mg by mouth 2 (two) times daily. (Patient not taking: Reported on 12/31/2022)     ivermectin (STROMECTOL) 3 MG TABS tablet Take 5 tablets by mouth daily. Pt taking as needed (Patient not taking: Reported on 12/31/2022)     No current facility-administered medications on file prior to visit.     There are no Patient Instructions on file for this visit. No follow-ups on file.  Kris Hartmann, NP

## 2023-01-23 ENCOUNTER — Encounter: Payer: Self-pay | Admitting: Family Medicine

## 2023-01-23 ENCOUNTER — Ambulatory Visit: Payer: BC Managed Care – PPO | Admitting: Family Medicine

## 2023-01-23 VITALS — BP 122/72 | HR 88 | Ht 68.0 in | Wt 206.0 lb

## 2023-01-23 DIAGNOSIS — I10 Essential (primary) hypertension: Secondary | ICD-10-CM

## 2023-01-23 DIAGNOSIS — E876 Hypokalemia: Secondary | ICD-10-CM | POA: Diagnosis not present

## 2023-01-23 MED ORDER — HYDROCHLOROTHIAZIDE 12.5 MG PO TABS: 12.5000 mg | ORAL_TABLET | Freq: Every day | ORAL | 1 refills | Status: DC

## 2023-01-23 MED ORDER — HYDROCHLOROTHIAZIDE 12.5 MG PO TABS: 12.5000 mg | ORAL_TABLET | Freq: Every day | ORAL | 0 refills | Status: DC

## 2023-01-23 NOTE — Patient Instructions (Signed)
Chronic Venous Insufficiency Chronic venous insufficiency is a condition where the leg veins cannot effectively pump blood from the legs to the heart. This happens when the vein walls are either stretched, weakened, or damaged, or when the valves inside the vein are damaged. With the right treatment, you should be able to continue with an active life. This condition is also called venous stasis. What are the causes? Common causes of this condition include: High blood pressure inside the veins (venous hypertension). Sitting or standing too long, causing increased blood pressure in the leg veins. A blood clot that blocks blood flow in a vein (deep vein thrombosis, DVT). Inflammation of a vein (phlebitis) that causes a blood clot to form. Tumors in the pelvis that cause blood to back up. What increases the risk? The following factors may make you more likely to develop this condition: Having a family history of this condition. Obesity. Pregnancy. Living without enough regular physical activity or exercise (sedentary lifestyle). Smoking. Having a job that requires long periods of standing or sitting in one place. Being a certain age. Women in their 40s and 50s and men in their 70s are more likely to develop this condition. What are the signs or symptoms? Symptoms of this condition include: Veins that are enlarged, bulging, or twisted (varicose veins). Skin breakdown or ulcers. Reddened skin or dark discoloration of skin on the leg between the knee and ankle. Brown, smooth, tight, and painful skin just above the ankle, usually on the inside of the leg (lipodermatosclerosis). Swelling of the legs. How is this diagnosed? This condition may be diagnosed based on: Your medical history. A physical exam. Tests, such as: A procedure that creates an image of a blood vessel and nearby organs and provides information about blood flow through the blood vessel (duplex ultrasound). A procedure that  tests blood flow (plethysmography). A procedure that looks at the veins using X-ray and dye (venogram). How is this treated? The goals of treatment are to help you return to an active life and to minimize pain or disability. Treatment depends on the severity of your condition, and it may include: Wearing compression stockings. These can help relieve symptoms and help prevent your condition from getting worse. However, they do not cure the condition. Sclerotherapy. This procedure involves an injection of a solution that shrinks damaged veins. Surgery. This may involve: Removing a diseased vein (vein stripping). Cutting off blood flow through the vein (laser ablation surgery). Repairing or reconstructing a valve within the affected vein. Follow these instructions at home:     Wear compression stockings as told by your health care provider. These stockings help to prevent blood clots and reduce swelling in your legs. Take over-the-counter and prescription medicines only as told by your health care provider. Stay active by exercising, walking, or doing different activities. Ask your health care provider what activities are safe for you and how much exercise you need. Drink enough fluid to keep your urine pale yellow. Do not use any products that contain nicotine or tobacco, such as cigarettes, e-cigarettes, and chewing tobacco. If you need help quitting, ask your health care provider. Keep all follow-up visits as told by your health care provider. This is important. Contact a health care provider if you: Have redness, swelling, or more pain in the affected area. See a red streak or line that goes up or down from the affected area. Have skin breakdown or skin loss in the affected area, even if the breakdown is small. Get   an injury in the affected area. Get help right away if: You get an injury and an open wound in the affected area. You have: Severe pain that does not get better with  medicine. Sudden numbness or weakness in the foot or ankle below the affected area. Trouble moving your foot or ankle. A fever. Worse or persistent symptoms. Chest pain. Shortness of breath. Summary Chronic venous insufficiency is a condition where the leg veins cannot effectively pump blood from the legs to the heart. Chronic venous insufficiency occurs when the vein walls become stretched, weakened, or damaged, or when valves within the vein are damaged. Treatment depends on how severe your condition is. It often involves wearing compression stockings and may involve having a procedure. Make sure you stay active by exercising, walking, or doing different activities. Ask your health care provider what activities are safe for you and how much exercise you need. This information is not intended to replace advice given to you by your health care provider. Make sure you discuss any questions you have with your health care provider. Document Revised: 12/03/2020 Document Reviewed: 12/04/2020 Elsevier Patient Education  2023 [employer] Inc[employer]  

## 2023-01-23 NOTE — Progress Notes (Signed)
Date:  01/23/2023   Name:  Sylvia Boyer   DOB:  [birth year]   MRN:  [id number]   Chief Complaint: DVT of deep femoral vein  Hypertension This is a chronic problem. The current episode started more than 1 year ago. The problem has been gradually improving since onset. The problem is controlled. Pertinent negatives include no anxiety, blurred vision, chest pain, headaches, malaise/fatigue, neck pain, orthopnea, palpitations, peripheral edema, PND, shortness of breath or sweats. There are no associated agents to hypertension. There are no known risk factors for coronary artery disease. Past treatments include diuretics. There are no compliance problems.  There is no history of angina, CAD/MI or CVA. history DVT. There is no history of a hypertension causing med or renovascular disease.    Lab Results  Component Value Date   NA 140 11/18/2022   K 3.3 (L) 11/18/2022   CO2 27 11/18/2022   GLUCOSE 121 (H) 11/18/2022   BUN 15 11/18/2022   CREATININE 0.74 11/18/2022   CALCIUM 9.3 11/18/2022   GFRNONAA >60 11/18/2022   Lab Results  Component Value Date   CHOL 163 02/27/2015   HDL 63 02/27/2015   LDLCALC 87 02/27/2015   TRIG 64 02/27/2015   Lab Results  Component Value Date   TSH 2.03 02/27/2015   No results found for: "HGBA1C" Lab Results  Component Value Date   WBC 10.4 11/18/2022   HGB 13.2 11/18/2022   HCT 38.3 11/18/2022   MCV 88.0 11/18/2022   PLT 257 11/18/2022   Lab Results  Component Value Date   ALT 21 11/17/2022   AST 22 11/17/2022   ALKPHOS 62 11/17/2022   BILITOT 0.8 11/17/2022   No results found for: "25OHVITD2", "25OHVITD3", "VD25OH"   Review of Systems  Constitutional: Negative.  Negative for chills, fatigue, fever, malaise/fatigue and unexpected weight change.  HENT:  Negative for congestion, ear pain and trouble swallowing.   Eyes:  Negative for blurred vision.  Respiratory:  Negative for cough, shortness of breath, wheezing and stridor.    Cardiovascular:  Positive for leg swelling. Negative for chest pain, palpitations, orthopnea and PND.  Gastrointestinal:  Negative for abdominal pain, blood in stool, constipation, diarrhea and nausea.  Genitourinary:  Negative for dysuria, flank pain, frequency and hematuria.  Musculoskeletal:  Negative for myalgias and neck pain.  Skin:  Negative for rash.  Neurological:  Negative for dizziness, weakness and headaches.  Hematological:  Negative for adenopathy. Does not bruise/bleed easily.  Psychiatric/Behavioral:  Negative for dysphoric mood. The patient is not nervous/anxious.     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  No outpatient medications have been marked as taking for the 01/23/23 encounter (Office Visit) with Duanne Limerick, MD.       01/23/2023  2:43 PM 11/28/2022    2:49 PM 02/22/2021   10:38 AM  GAD 7 : Generalized Anxiety Score  Nervous, Anxious, on Edge 0 0 0  Control/stop worrying 0 0 0  Worry too much - different things 0 0 0  Trouble relaxing 0 0 0  Restless 0 0 0  Easily annoyed or irritable 0 0 0  Afraid - awful might happen 0 0 0  Total GAD 7 Score 0 0 0  Anxiety Difficulty Not difficult at all Not difficult at all        01/23/2023    2:43 PM 11/28/2022    2:48 PM 02/22/2021   10:37 AM  Depression screen  PHQ 2/9  Decreased Interest 0 0 0  Down, Depressed, Hopeless 0 0 0  PHQ - 2 Score 0 0 0  Altered sleeping 0 0 0  Tired, decreased energy 0 0 0  Change in appetite 0 0 0  Feeling bad or failure about yourself  0 0 0  Trouble concentrating 0 0 0  Moving slowly or fidgety/restless 0 0 0  Suicidal thoughts 0 0 0  PHQ-9 Score 0 0 0  Difficult doing work/chores Not difficult at all Not difficult at all     BP Readings from Last 3 Encounters:  01/23/23 122/72  12/31/22 105/72  11/28/22 134/82    Physical Exam Vitals and nursing note reviewed.  HENT:     Head: Normocephalic.     Right Ear: Tympanic membrane, ear canal and external ear normal. There is no impacted cerumen.     Left Ear: Tympanic membrane, ear canal and external ear normal. There is no impacted cerumen.     Nose: No congestion or rhinorrhea.     Mouth/Throat:     Pharynx: No oropharyngeal exudate or posterior oropharyngeal erythema.  Cardiovascular:     Rate and Rhythm: Normal rate.     Heart sounds: No murmur heard.    No friction rub. No gallop.  Pulmonary:     Breath sounds: No wheezing, rhonchi or rales.  Neurological:     Mental Status: She is alert.     Wt Readings from Last 3 Encounters:  01/23/23 206 lb (93.4 kg)  12/31/22 204 lb 12.8 oz (92.9 kg)  11/28/22 202 lb (91.6 kg)    BP 122/72   Pulse 88   Ht  (1.727 m)   Wt 206 lb (93.4 kg)   SpO2 98%   BMI 31.32 kg/m   Assessment and Plan: 1. Primary hypertension Chronic.  Controlled.  Stable.  Blood pressure today is 122/72.  Asymptomatic.  Tolerating medication well at current dosing.  Reducing of the hydrochlorothiazide to 12.5 mg and blood pressure is maintaining on current dosing.  We will continue at 12.5 mg hydrochlorothiazide and will repeat in 6 months. - hydrochlorothiazide (HYDRODIURIL) 12.5 MG tablet; Take 1 tablet (12.5 mg total) by mouth daily.  Dispense: 90 tablet; Refill: 1  2. Hypokalemia Chronic.  Controlled.  Stable.   Decrease potassium on 25 mg hydrochlorothiazide will recheck on 12.5 mg once a day and adjust accordingly if dosage needed for supplementation. - Potassium     Elizabeth Sauer, MD

## 2023-01-24 LAB — POTASSIUM: Potassium: 4.1 mmol/L (ref 3.5–5.2)

## 2023-01-30 ENCOUNTER — Other Ambulatory Visit (INDEPENDENT_AMBULATORY_CARE_PROVIDER_SITE_OTHER): Payer: Self-pay

## 2023-01-30 MED ORDER — APIXABAN 5 MG PO TABS: 5.0000 mg | ORAL_TABLET | Freq: Two times a day (BID) | ORAL | 2 refills | Status: DC

## 2023-01-30 NOTE — Telephone Encounter (Signed)
Refill of Eliquis sent with 2 refills

## 2023-02-25 ENCOUNTER — Other Ambulatory Visit (INDEPENDENT_AMBULATORY_CARE_PROVIDER_SITE_OTHER): Payer: Self-pay | Admitting: Nurse Practitioner

## 2023-02-25 DIAGNOSIS — I82411 Acute embolism and thrombosis of right femoral vein: Secondary | ICD-10-CM

## 2023-03-03 ENCOUNTER — Encounter (INDEPENDENT_AMBULATORY_CARE_PROVIDER_SITE_OTHER): Payer: Self-pay | Admitting: Nurse Practitioner

## 2023-03-03 ENCOUNTER — Ambulatory Visit (INDEPENDENT_AMBULATORY_CARE_PROVIDER_SITE_OTHER): Payer: BC Managed Care – PPO

## 2023-03-03 ENCOUNTER — Ambulatory Visit (INDEPENDENT_AMBULATORY_CARE_PROVIDER_SITE_OTHER): Payer: BC Managed Care – PPO | Admitting: Nurse Practitioner

## 2023-03-03 VITALS — BP 125/80 | HR 83 | Resp 16 | Wt 207.8 lb

## 2023-03-03 DIAGNOSIS — I82411 Acute embolism and thrombosis of right femoral vein: Secondary | ICD-10-CM

## 2023-03-03 MED ORDER — APIXABAN 5 MG PO TABS: 5.0000 mg | ORAL_TABLET | Freq: Two times a day (BID) | ORAL | 5 refills | Status: DC

## 2023-03-03 NOTE — Progress Notes (Signed)
Subjective:    Patient ID: Sylvia Boyer, female    DOB: 06-14-1961, 62 y.o.   MRN: [id number] Chief Complaint  Patient presents with   Follow-up    Ultrasound follow up    The patient is a 62 year old female who returns today for follow-up evaluation of DVT.  Since her last visit she notes that the swelling has improved.  It is gone down drastically but she does have swelling throughout the day.  She also notes that she is able to walk for extended distances such as a couple of miles without having any pain or discomfort.  There have been no open wounds or ulcerations.  She has been compliant with her Eliquis and denies having any issues with it currently.  Today noninvasive studies show evidence of DVT in the femoral through popliteal veins.  She previously had DVT in the tibial vessels.    Review of Systems  Cardiovascular:  Positive for leg swelling.  All other systems reviewed and are negative.      Objective:   Physical Exam Vitals reviewed.  HENT:     Head: Normocephalic.  Cardiovascular:     Rate and Rhythm: Normal rate.  Pulmonary:     Effort: Pulmonary effort is normal.  Musculoskeletal:     Right lower leg: Edema present.  Skin:    General: Skin is warm and dry.  Neurological:     Mental Status: She is alert and oriented to person, place, and time.  Psychiatric:        Mood and Affect: Mood normal.        Behavior: Behavior normal.        Thought Content: Thought content normal.        Judgment: Judgment normal.     BP 125/80 (BP Location: Left Arm)   Pulse 83   Resp 16   Wt 207 lb 12.8 oz (94.3 kg)   BMI 31.60 kg/m   Past Medical History:  Diagnosis Date   Anxiety    DVT (deep venous thrombosis) (HCC)     Social History   Socioeconomic History   Marital status: Married    Spouse name: Not on file   Number of children: Not on file   Years of education: Not on file   Highest education level: Not on file  Occupational History   Not on  file  Tobacco Use   Smoking status: Never   Smokeless tobacco: Never  Substance and Sexual Activity   Alcohol use: Yes    Alcohol/week: 0.0 standard drinks of alcohol   Drug use: No   Sexual activity: Not Currently  Other Topics Concern   Not on file  Social History Narrative   Not on file   Social Determinants of Health   Financial Resource Strain: Not on file  Food Insecurity: No Food Insecurity (11/18/2022)   Hunger Vital Sign    Worried About Running Out of Food in the Last Year: Never true    Ran Out of Food in the Last Year: Never true  Transportation Needs: No Transportation Needs (11/18/2022)   PRAPARE - Administrator, Civil Service (Medical): No    Lack of Transportation (Non-Medical): No  Physical Activity: Not on file  Stress: Not on file  Social Connections: Not on file  Intimate Partner Violence: Not At Risk (11/18/2022)   Humiliation, Afraid, Rape, and Kick questionnaire    Fear of Current or Ex-Partner: No    Emotionally Abused: No  Physically Abused: No    Sexually Abused: No    Past Surgical History:  Procedure Laterality Date   VAGINAL HYSTERECTOMY      Family History  Problem Relation Age of Onset   Ovarian cancer Mother    Varicose Veins Father    Deep vein thrombosis Father     Allergies  Allergen Reactions   Erythromycin Nausea Only       Latest Ref Rng & Units 11/18/2022    4:33 AM 11/17/2022    8:48 PM 02/27/2015   12:00 AM  CBC  WBC 4.0 - 10.5 K/uL 10.4  11.9    Hemoglobin 12.0 - 15.0 g/dL 96.0  45.4  09.8   Hematocrit 36.0 - 46.0 % 38.3  40.9    Platelets 150 - 400 K/uL 257  257        CMP     Component Value Date/Time   NA 140 11/18/2022 0433   K 4.1 01/23/2023 1513   CL 105 11/18/2022 0433   CO2 27 11/18/2022 0433   GLUCOSE 121 (H) 11/18/2022 0433   BUN 15 11/18/2022 0433   BUN 14 02/27/2015 0000   CREATININE 0.74 11/18/2022 0433   CALCIUM 9.3 11/18/2022 0433   PROT 7.5 11/17/2022 2048   ALBUMIN 4.1  11/17/2022 2048   AST 22 11/17/2022 2048   ALT 21 11/17/2022 2048   ALKPHOS 62 11/17/2022 2048   BILITOT 0.8 11/17/2022 2048   GFRNONAA >60 11/18/2022 0433     No results found.     Assessment & Plan:   1. DVT of deep femoral vein, right (HCC) The patient has DVT is still present in the right lower extremity.  The studies indicate that there is acute/chronic and reviewed images of liver the images is primarily chronic.  It does show some improvement as the tibial vessels no longer display evidence of thrombus.  The patient will continue with Eliquis and I will send in a refill today.  Will have her return in late July as she will be traveling to [place] with little medical access in order to evaluate   Current Outpatient Medications on File Prior to Visit  Medication Sig Dispense Refill   ALPRAZolam (XANAX) 0.25 MG tablet Take 1 tablet (0.25 mg total) by mouth at bedtime as needed for anxiety. 20 tablet 0   hydrochlorothiazide (HYDRODIURIL) 12.5 MG tablet Take 1 tablet (12.5 mg total) by mouth daily. 90 tablet 1   ondansetron (ZOFRAN) 8 MG tablet Take 4 mg by mouth 2 (two) times daily as needed.     Vitamin D, Ergocalciferol, (DRISDOL) 1.25 MG (50000 UNIT) CAPS capsule Take 50,000 Units by mouth every 7 (seven) days.     amoxicillin (AMOXIL) 500 MG capsule Take 500 mg by mouth 2 (two) times daily. (Patient not taking: Reported on 12/31/2022)     ivermectin (STROMECTOL) 3 MG TABS tablet Take 5 tablets by mouth daily. Pt taking as needed (Patient not taking: Reported on 12/31/2022)     No current facility-administered medications on file prior to visit.    There are no Patient Instructions on file for this visit. No follow-ups on file.   Georgiana Spinner, NP

## 2023-05-04 ENCOUNTER — Other Ambulatory Visit (INDEPENDENT_AMBULATORY_CARE_PROVIDER_SITE_OTHER): Payer: Self-pay | Admitting: Nurse Practitioner

## 2023-05-04 DIAGNOSIS — I82411 Acute embolism and thrombosis of right femoral vein: Secondary | ICD-10-CM

## 2023-05-06 ENCOUNTER — Ambulatory Visit (INDEPENDENT_AMBULATORY_CARE_PROVIDER_SITE_OTHER): Payer: BC Managed Care – PPO | Admitting: Nurse Practitioner

## 2023-05-06 ENCOUNTER — Encounter (INDEPENDENT_AMBULATORY_CARE_PROVIDER_SITE_OTHER): Payer: Self-pay | Admitting: Nurse Practitioner

## 2023-05-06 ENCOUNTER — Ambulatory Visit (INDEPENDENT_AMBULATORY_CARE_PROVIDER_SITE_OTHER): Payer: BC Managed Care – PPO

## 2023-05-06 VITALS — BP 109/73 | HR 93 | Resp 18 | Ht 68.0 in | Wt 209.4 lb

## 2023-05-06 DIAGNOSIS — I82411 Acute embolism and thrombosis of right femoral vein: Secondary | ICD-10-CM

## 2023-05-06 NOTE — Progress Notes (Signed)
Subjective:    Patient ID: Sylvia Boyer, female    DOB: 1960-10-19, 62 y.o.   MRN: [id number] Chief Complaint  Patient presents with   Follow-up    late July right DVT    The patient is a 62 year old female who returns today for follow-up evaluation of DVT.  Since her last visit she notes that the swelling has improved.  She continues to utilize medical grade compression on a consistent basis.  She is also continue to walk a couple miles daily without any significant issues.  She is also compliant on her Eliquis without significant bleeding episodes or issues.   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  She has full compressibility throughout the deep venous system.  This is a notable improvement from previous studies.     Review of Systems  Cardiovascular:  Negative for leg swelling.       Objective:   Physical Exam Vitals reviewed.  HENT:     Head: Normocephalic.  Cardiovascular:     Rate and Rhythm: Normal rate.     Pulses: Normal pulses.  Pulmonary:     Effort: Pulmonary effort is normal.  Musculoskeletal:     Right lower leg: No edema.  Skin:    General: Skin is warm and dry.  Neurological:     Mental Status: She is alert and oriented to person, place, and time.  Psychiatric:        Mood and Affect: Mood normal.        Behavior: Behavior normal.        Thought Content: Thought content normal.        Judgment: Judgment normal.     BP 109/73 (BP Location: Left Arm)   Pulse 93   Resp 18   Ht 5\' 8"  (1.727 m)   Wt 209 lb 6.4 oz (95 kg)   BMI 31.84 kg/m   Past Medical History:  Diagnosis Date   Anxiety    DVT (deep venous thrombosis) (HCC)     Social History   Socioeconomic History   Marital status: Married    Spouse name: Not on file   Number of children: Not on file   Years of education: Not on file   Highest education level: Not on file  Occupational History   Not on file  Tobacco  Use   Smoking status: Never   Smokeless tobacco: Never  Substance and Sexual Activity   Alcohol use: Yes    Alcohol/week: 0.0 standard drinks of alcohol   Drug use: No   Sexual activity: Not Currently  Other Topics Concern   Not on file  Social History Narrative   Not on file   Social Determinants of Health   Financial Resource Strain: Not on file  Food Insecurity: No Food Insecurity (11/18/2022)   Hunger Vital Sign    Worried About Running Out of Food in the Last Year: Never true    Ran Out of Food in the Last Year: Never true  Transportation Needs: No Transportation Needs (11/18/2022)   PRAPARE - Administrator, Civil Service (Medical): No    Lack of Transportation (Non-Medical): No  Physical Activity: Not on file  Stress: Not on file  Social Connections: Not on file  Intimate Partner Violence: Not At Risk (11/18/2022)   Humiliation, Afraid, Rape, and Kick questionnaire    Fear of Current or Ex-Partner: No    Emotionally Abused: No  Physically Abused: No    Sexually Abused: No    Past Surgical History:  Procedure Laterality Date   VAGINAL HYSTERECTOMY      Family History  Problem Relation Age of Onset   Ovarian cancer Mother    Varicose Veins Father    Deep vein thrombosis Father     Allergies  Allergen Reactions   Erythromycin Nausea Only       Latest Ref Rng & Units 11/18/2022    4:33 AM 11/17/2022    8:48 PM 02/27/2015   12:00 AM  CBC  WBC 4.0 - 10.5 K/uL 10.4  11.9    Hemoglobin 12.0 - 15.0 g/dL 40.9  81.1  91.4   Hematocrit 36.0 - 46.0 % 38.3  40.9    Platelets 150 - 400 K/uL 257  257        CMP     Component Value Date/Time   NA 140 11/18/2022 0433   K 4.1 01/23/2023 1513   CL 105 11/18/2022 0433   CO2 27 11/18/2022 0433   GLUCOSE 121 (H) 11/18/2022 0433   BUN 15 11/18/2022 0433   BUN 14 02/27/2015 0000   CREATININE 0.74 11/18/2022 0433   CALCIUM 9.3 11/18/2022 0433   PROT 7.5 11/17/2022 2048   ALBUMIN 4.1 11/17/2022 2048    AST 22 11/17/2022 2048   ALT 21 11/17/2022 2048   ALKPHOS 62 11/17/2022 2048   BILITOT 0.8 11/17/2022 2048   GFRNONAA >60 11/18/2022 0433     No results found.     Assessment & Plan:   1. DVT of deep femoral vein, right (HCC) Ultrasound today shows drastic improvement of the previous DVT.  Significant recannulization of the deep venous system of the right lower extremity.  There is just a small amount of chronic thrombus in the right femoral vein.  Following this we had a significant discussion in regards to the ongoing anticoagulation.  We discussed that we will recommend at least 6 full months of anticoagulation and she started with anticoagulation about February 13.  Based on that she should have approximately 2 or so weeks left until she is at 6 months.  However she may be traveling to an [place] where medical care is remote, in which case out advised that she continues with her full 5 mg twice daily dose.  When she returns I would recommend transitioning to 2.5 mg prophylactic dose to be done for 6 months.  The patient is unsure if she will be traveling.  She will contact [place] to let us know when she notes that she is not traveling or home and at that time we can send in 2.5 mg dose for the patient.  We will have her back in 6 months to repeat studies.   Current Outpatient Medications on File Prior to Visit  Medication Sig Dispense Refill   ALPRAZolam (XANAX) 0.25 MG tablet Take 1 tablet (0.25 mg total) by mouth at bedtime as needed for anxiety. 20 tablet 0   apixaban (ELIQUIS) 5 MG TABS tablet Take 1 tablet (5 mg total) by mouth 2 (two) times daily. Start after completing starter pack 60 tablet 5   dicyclomine (BENTYL) 10 MG/5ML solution Take 10 mg by mouth 4 (four) times daily -  before meals and at bedtime.     hydrochlorothiazide (HYDRODIURIL) 12.5 MG tablet Take 1 tablet (12.5 mg total) by mouth daily. 90 tablet 1   ondansetron (ZOFRAN) 8 MG tablet Take 4 mg by mouth 2 (two) times  daily as needed.     Vitamin D, Ergocalciferol, (DRISDOL) 1.25 MG (50000 UNIT) CAPS capsule Take 50,000 Units by mouth every 7 (seven) days.     ivermectin (STROMECTOL) 3 MG TABS tablet Take 5 tablets by mouth daily. Pt taking as needed (Patient not taking: Reported on 12/31/2022)     No current facility-administered medications on file prior to visit.    There are no Patient Instructions on file for this visit. No follow-ups on file.   Georgiana Spinner, NP

## 2023-05-14 ENCOUNTER — Other Ambulatory Visit (INDEPENDENT_AMBULATORY_CARE_PROVIDER_SITE_OTHER): Payer: Self-pay | Admitting: Nurse Practitioner

## 2023-08-04 ENCOUNTER — Encounter: Payer: Self-pay | Admitting: Family Medicine

## 2023-08-04 ENCOUNTER — Ambulatory Visit: Payer: BC Managed Care – PPO | Admitting: Family Medicine

## 2023-08-04 VITALS — BP 118/74 | HR 96 | Ht 68.0 in | Wt 212.0 lb

## 2023-08-04 DIAGNOSIS — I10 Essential (primary) hypertension: Secondary | ICD-10-CM

## 2023-08-04 MED ORDER — HYDROCHLOROTHIAZIDE 12.5 MG PO TABS
12.5000 mg | ORAL_TABLET | Freq: Every day | ORAL | 1 refills | Status: DC
Start: 2023-08-04 — End: 2024-01-18

## 2023-08-04 NOTE — Progress Notes (Signed)
Date:  08/04/2023   Name:  Sylvia Boyer   DOB:  June 30, 1961   MRN:  [id number]   Chief Complaint: Hypertension  Hypertension This is a chronic problem. The current episode started more than 1 year ago. The problem has been gradually improving since onset. The problem is controlled. Pertinent negatives include no anxiety, blurred vision, chest pain, headaches, malaise/fatigue, neck pain, orthopnea, palpitations, peripheral edema, PND, shortness of breath or sweats. There are no associated agents to hypertension. Past treatments include diuretics. The current treatment provides moderate improvement. There are no compliance problems.  There is no history of angina, kidney disease, CAD/MI, CVA, heart failure, left ventricular hypertrophy, PVD or retinopathy. There is no history of chronic renal disease, a hypertension causing med or renovascular disease.    Lab Results  Component Value Date   NA 140 11/18/2022   K 4.1 01/23/2023   CO2 27 11/18/2022   GLUCOSE 121 (H) 11/18/2022   BUN 15 11/18/2022   CREATININE 0.74 11/18/2022   CALCIUM 9.3 11/18/2022   GFRNONAA >60 11/18/2022   Lab Results  Component Value Date   CHOL 163 02/27/2015   HDL 63 02/27/2015   LDLCALC 87 02/27/2015   TRIG 64 02/27/2015   Lab Results  Component Value Date   TSH 2.03 02/27/2015   No results found for: "HGBA1C" Lab Results  Component Value Date   WBC 10.4 11/18/2022   HGB 13.2 11/18/2022   HCT 38.3 11/18/2022   MCV 88.0 11/18/2022   PLT 257 11/18/2022   Lab Results  Component Value Date   ALT 21 11/17/2022   AST 22 11/17/2022   ALKPHOS 62 11/17/2022   BILITOT 0.8 11/17/2022   No results found for: "25OHVITD2", "25OHVITD3", "VD25OH"   Review of Systems  Constitutional:  Negative for malaise/fatigue.  Eyes:  Negative for blurred vision.  Respiratory:  Negative for apnea, cough, choking, shortness of breath and wheezing.   Cardiovascular:  Negative for chest pain, palpitations,  orthopnea and PND.  Gastrointestinal:  Negative for abdominal pain and blood in stool.  Endocrine: Negative for cold intolerance, polydipsia and polyuria.  Genitourinary:  Negative for difficulty urinating, dysuria, flank pain and genital sores.  Musculoskeletal:  Negative for neck pain.  Neurological:  Negative for headaches.    Patient Active Problem List   Diagnosis Date Noted   Hypokalemia 11/17/2022   DVT of deep femoral vein, right (HCC) 11/17/2022   Episodic paroxysmal anxiety disorder 05/04/2018   Adenomatous polyp of ascending colon 01/27/2017   Routine general medical examination at a health care facility 02/28/2015   Anxiety 02/28/2015   Adaptive colitis 02/28/2015    Allergies  Allergen Reactions   Erythromycin Nausea Only    Past Surgical History:  Procedure Laterality Date   VAGINAL HYSTERECTOMY      Social History   Tobacco Use   Smoking status: Never   Smokeless tobacco: Never  Substance Use Topics   Alcohol use: Yes    Alcohol/week: 0.0 standard drinks of alcohol   Drug use: No     Medication list has been reviewed and updated.  Current Meds  Medication Sig   ALPRAZolam (XANAX) 0.25 MG tablet Take 1 tablet (0.25 mg total) by mouth at bedtime as needed for anxiety.   Ascorbic Acid (VITA-C PO) Take by mouth.   dicyclomine (BENTYL) 10 MG/5ML solution Take 10 mg by mouth 4 (four) times daily -  before meals and at bedtime.   ELIQUIS 5 MG TABS tablet TAKE  1 TABLET(5 MG) BY MOUTH TWICE DAILY. START AFTER COMPLETING STARTER PACK   hydrochlorothiazide (HYDRODIURIL) 12.5 MG tablet Take 1 tablet (12.5 mg total) by mouth daily.   ivermectin (STROMECTOL) 3 MG TABS tablet Take 5 tablets by mouth daily. Pt taking as needed   Multiple Vitamins-Minerals (ZINC PO) Take by mouth.   ondansetron (ZOFRAN) 8 MG tablet Take 4 mg by mouth 2 (two) times daily as needed.   Vitamin D, Ergocalciferol, (DRISDOL) 1.25 MG (50000 UNIT) CAPS capsule Take 50,000 Units by mouth  every 7 (seven) days.       01/23/2023    2:43 PM 11/28/2022    2:49 PM 02/22/2021   10:38 AM  GAD 7 : Generalized Anxiety Score  Nervous, Anxious, on Edge 0 0 0  Control/stop worrying 0 0 0  Worry too much - different things 0 0 0  Trouble relaxing 0 0 0  Restless 0 0 0  Easily annoyed or irritable 0 0 0  Afraid - awful might happen 0 0 0  Total GAD 7 Score 0 0 0  Anxiety Difficulty Not difficult at all Not difficult at all        01/23/2023    2:43 PM 11/28/2022    2:48 PM 02/22/2021   10:37 AM  Depression screen PHQ 2/9  Decreased Interest 0 0 0  Down, Depressed, Hopeless 0 0 0  PHQ - 2 Score 0 0 0  Altered sleeping 0 0 0  Tired, decreased energy 0 0 0  Change in appetite 0 0 0  Feeling bad or failure about yourself  0 0 0  Trouble concentrating 0 0 0  Moving slowly or fidgety/restless 0 0 0  Suicidal thoughts 0 0 0  PHQ-9 Score 0 0 0  Difficult doing work/chores Not difficult at all Not difficult at all     BP Readings from Last 3 Encounters:  08/04/23 118/74  05/06/23 109/73  03/03/23 125/80    Physical Exam Vitals and nursing note reviewed. Exam conducted with a chaperone present.  Constitutional:      General: She is not in acute distress.    Appearance: She is not diaphoretic.  HENT:     Head: Normocephalic and atraumatic.     Right Ear: Tympanic membrane, ear canal and external ear normal. There is no impacted cerumen.     Left Ear: Tympanic membrane, ear canal and external ear normal. There is no impacted cerumen.     Nose: Nose normal. No congestion or rhinorrhea.  Eyes:     General:        Right eye: No discharge.        Left eye: No discharge.     Conjunctiva/sclera: Conjunctivae normal.     Pupils: Pupils are equal, round, and reactive to light.  Neck:     Thyroid: No thyromegaly.     Vascular: No JVD.  Cardiovascular:     Rate and Rhythm: Normal rate and regular rhythm.     Heart sounds: Normal heart sounds. No murmur heard.    No friction  rub. No gallop.  Pulmonary:     Effort: Pulmonary effort is normal.     Breath sounds: Normal breath sounds. No wheezing, rhonchi or rales.  Abdominal:     General: Bowel sounds are normal.     Palpations: Abdomen is soft. There is no mass.     Tenderness: There is no abdominal tenderness. There is no guarding or rebound.     Hernia:  No hernia is present.  Musculoskeletal:        General: Normal range of motion.     Cervical back: Normal range of motion and neck supple.  Lymphadenopathy:     Cervical: No cervical adenopathy.  Skin:    General: Skin is warm and dry.     Coloration: Skin is not jaundiced or pale.     Findings: No bruising, erythema, lesion or rash.  Neurological:     Mental Status: She is alert.     Wt Readings from Last 3 Encounters:  08/04/23 212 lb (96.2 kg)  05/06/23 209 lb 6.4 oz (95 kg)  03/03/23 207 lb 12.8 oz (94.3 kg)    BP 118/74   Pulse 96   Ht 5\' 8"  (1.727 m)   Wt 212 lb (96.2 kg)   SpO2 97%   BMI 32.23 kg/m   Assessment and Plan: 1. Primary hypertension Chronic.  Controlled.  Stable.  Blood pressure today is 118/74.  Asymptomatic.  Tolerating medication well.  Will continue hydrochlorothiazide 12.5 mg once a day.  Will check renal function panel for electrolytes and GFR (patient has had decreased potassium in the past).  Will recheck patient in 6 months. - hydrochlorothiazide (HYDRODIURIL) 12.5 MG tablet; Take 1 tablet (12.5 mg total) by mouth daily.  Dispense: 90 tablet; Refill: 1 - Renal Function Panel     Elizabeth Sauer, MD

## 2023-08-05 ENCOUNTER — Encounter: Payer: Self-pay | Admitting: Family Medicine

## 2023-08-05 LAB — RENAL FUNCTION PANEL
Albumin: 4.3 g/dL (ref 3.9–4.9)
BUN/Creatinine Ratio: 15 (ref 12–28)
BUN: 12 mg/dL (ref 8–27)
CO2: 26 mmol/L (ref 20–29)
Calcium: 9.8 mg/dL (ref 8.7–10.3)
Chloride: 104 mmol/L (ref 96–106)
Creatinine, Ser: 0.78 mg/dL (ref 0.57–1.00)
Glucose: 119 mg/dL — ABNORMAL HIGH (ref 70–99)
Phosphorus: 2.6 mg/dL — ABNORMAL LOW (ref 3.0–4.3)
Potassium: 4 mmol/L (ref 3.5–5.2)
Sodium: 144 mmol/L (ref 134–144)
eGFR: 85 mL/min/{1.73_m2} (ref >59)

## 2023-08-09 ENCOUNTER — Encounter (INDEPENDENT_AMBULATORY_CARE_PROVIDER_SITE_OTHER): Payer: Self-pay

## 2023-10-02 ENCOUNTER — Telehealth (INDEPENDENT_AMBULATORY_CARE_PROVIDER_SITE_OTHER): Payer: Self-pay | Admitting: Nurse Practitioner

## 2023-10-02 ENCOUNTER — Other Ambulatory Visit (INDEPENDENT_AMBULATORY_CARE_PROVIDER_SITE_OTHER): Payer: Self-pay | Admitting: Nurse Practitioner

## 2023-10-02 DIAGNOSIS — I82411 Acute embolism and thrombosis of right femoral vein: Secondary | ICD-10-CM

## 2023-10-02 NOTE — Telephone Encounter (Signed)
LVM for pt in regards to her message left on our VM. Patient wanted to come in this week or next and have her leg checked out before traveling. I advised that we do not have any appointments before she leaves on 1.3.24. I advised patient to call back with any questions.

## 2023-10-06 ENCOUNTER — Ambulatory Visit (INDEPENDENT_AMBULATORY_CARE_PROVIDER_SITE_OTHER): Payer: BC Managed Care – PPO

## 2023-10-06 DIAGNOSIS — I82411 Acute embolism and thrombosis of right femoral vein: Secondary | ICD-10-CM | POA: Diagnosis not present

## 2023-11-04 ENCOUNTER — Other Ambulatory Visit (INDEPENDENT_AMBULATORY_CARE_PROVIDER_SITE_OTHER): Payer: Self-pay | Admitting: Nurse Practitioner

## 2023-11-10 ENCOUNTER — Other Ambulatory Visit (INDEPENDENT_AMBULATORY_CARE_PROVIDER_SITE_OTHER): Payer: Self-pay | Admitting: Nurse Practitioner

## 2023-11-10 DIAGNOSIS — I82411 Acute embolism and thrombosis of right femoral vein: Secondary | ICD-10-CM

## 2023-11-13 ENCOUNTER — Encounter (INDEPENDENT_AMBULATORY_CARE_PROVIDER_SITE_OTHER): Payer: Self-pay | Admitting: Nurse Practitioner

## 2023-11-13 ENCOUNTER — Ambulatory Visit (INDEPENDENT_AMBULATORY_CARE_PROVIDER_SITE_OTHER): Payer: Medicare PPO

## 2023-11-13 ENCOUNTER — Encounter (INDEPENDENT_AMBULATORY_CARE_PROVIDER_SITE_OTHER): Payer: Self-pay

## 2023-11-13 ENCOUNTER — Ambulatory Visit (INDEPENDENT_AMBULATORY_CARE_PROVIDER_SITE_OTHER): Payer: Medicare PPO | Admitting: Nurse Practitioner

## 2023-11-13 VITALS — BP 113/76 | HR 80 | Resp 16 | Wt 206.8 lb

## 2023-11-13 DIAGNOSIS — I82411 Acute embolism and thrombosis of right femoral vein: Secondary | ICD-10-CM | POA: Diagnosis not present

## 2023-11-15 ENCOUNTER — Encounter (INDEPENDENT_AMBULATORY_CARE_PROVIDER_SITE_OTHER): Payer: Self-pay | Admitting: Nurse Practitioner

## 2023-11-15 NOTE — Progress Notes (Signed)
 Subjective:    Patient ID: Sylvia Boyer, female    DOB: 07-Mar-1961, 63 y.o.   MRN: [id number] Chief Complaint  Patient presents with   Follow-up    6 month right dvt follow up     The patient is a 63 year old female who returns today for follow-up evaluation of DVT.  She notes that she has not had any significant issues with swelling since her last visit.  She wears medical grade compression socks consistently.  She continues to be active daily.  She has been on Eliquis  5 mg twice a day without any significant issues.  She had a recent DVT study on 10/06/2023 which was negative for any DVT which shows improvement from her most recent DVT follow-up.  Overall she has been doing much better over the last year.      Review of Systems  Cardiovascular:  Negative for leg swelling.  All other systems reviewed and are negative.      Objective:   Physical Exam Vitals reviewed.  HENT:     Head: Normocephalic.  Cardiovascular:     Rate and Rhythm: Normal rate.  Pulmonary:     Effort: Pulmonary effort is normal.  Musculoskeletal:     Right lower leg: No edema.     Left lower leg: No edema.  Skin:    General: Skin is warm and dry.  Neurological:     Mental Status: She is alert and oriented to person, place, and time.  Psychiatric:        Mood and Affect: Mood normal.        Behavior: Behavior normal.        Thought Content: Thought content normal.        Judgment: Judgment normal.     BP 113/76   Pulse 80   Resp 16   Wt 206 lb 12.8 oz (93.8 kg)   BMI 31.44 kg/m   Past Medical History:  Diagnosis Date   Anxiety    DVT (deep venous thrombosis) (HCC)     Social History   Socioeconomic History   Marital status: Married    Spouse name: Not on file   Number of children: Not on file   Years of education: Not on file   Highest education level: Associate degree: occupational, scientist, product/process development, or vocational program  Occupational History   Not on file  Tobacco Use    Smoking status: Never   Smokeless tobacco: Never  Substance and Sexual Activity   Alcohol use: Yes    Alcohol/week: 0.0 standard drinks of alcohol   Drug use: No   Sexual activity: Not Currently  Other Topics Concern   Not on file  Social History Narrative   Not on file   Social Drivers of Health   Financial Resource Strain: Low Risk  (08/03/2023)   Overall Financial Resource Strain (CARDIA)    Difficulty of Paying Living Expenses: Not hard at all  Food Insecurity: No Food Insecurity (08/03/2023)   Hunger Vital Sign    Worried About Running Out of Food in the Last Year: Never true    Ran Out of Food in the Last Year: Never true  Transportation Needs: No Transportation Needs (08/03/2023)   PRAPARE - Administrator, Civil Service (Medical): No    Lack of Transportation (Non-Medical): No  Physical Activity: Sufficiently Active (08/03/2023)   Exercise Vital Sign    Days of Exercise per Week: 7 days    Minutes of Exercise per Session:  50 min  Stress: No Stress Concern Present (08/03/2023)   [employer] of Occupational Health - Occupational Stress Questionnaire    Feeling of Stress : Not at all  Social Connections: Moderately Integrated (08/03/2023)   Social Connection and Isolation Panel [NHANES]    Frequency of Communication with Friends and Family: Three times a week    Frequency of Social Gatherings with Friends and Family: Three times a week    Attends Religious Services: 1 to 4 times per year    Active Member of Clubs or Organizations: No    Attends Engineer, Structural: Not on file    Marital Status: Married  Intimate Partner Violence: Not At Risk (11/18/2022)   Humiliation, Afraid, Rape, and Kick questionnaire    Fear of Current or Ex-Partner: No    Emotionally Abused: No    Physically Abused: No    Sexually Abused: No    Past Surgical History:  Procedure Laterality Date   VAGINAL HYSTERECTOMY      Family History  Problem Relation Age  of Onset   Ovarian cancer Mother    Varicose Veins Father    Deep vein thrombosis Father     Allergies  Allergen Reactions   Erythromycin Nausea Only       Latest Ref Rng & Units 11/18/2022    4:33 AM 11/17/2022    8:48 PM 02/27/2015   12:00 AM  CBC  WBC 4.0 - 10.5 K/uL 10.4  11.9    Hemoglobin 12.0 - 15.0 g/dL 86.7  85.6  86.0   Hematocrit 36.0 - 46.0 % 38.3  40.9    Platelets 150 - 400 K/uL 257  257        CMP     Component Value Date/Time   NA 144 08/04/2023 1336   K 4.0 08/04/2023 1336   CL 104 08/04/2023 1336   CO2 26 08/04/2023 1336   GLUCOSE 119 (H) 08/04/2023 1336   GLUCOSE 121 (H) 11/18/2022 0433   BUN 12 08/04/2023 1336   CREATININE 0.78 08/04/2023 1336   CALCIUM 9.8 08/04/2023 1336   PROT 7.5 11/17/2022 2048   ALBUMIN 4.3 08/04/2023 1336   AST 22 11/17/2022 2048   ALT 21 11/17/2022 2048   ALKPHOS 62 11/17/2022 2048   BILITOT 0.8 11/17/2022 2048   EGFR 85 08/04/2023 1336   GFRNONAA >60 11/18/2022 0433     No results found.     Assessment & Plan:   1. DVT of deep femoral vein, right (HCC) (Primary) Previous studies on 10/06/2023 showed no evidence of previous DVT.  The patient had been on Eliquis  5 mg twice daily but given that she is not having any symptoms or issues at this time it certainly reasonable to transition away from Eliquis .  She will continue her bottle until she is finished and then transition to an 81 mg of aspirin.  She is advised to continue with medical grade compression therapy as well as elevating her lower extremities when possible.  She does travel frequently and so she is advised to walk every 2 hours or so.  The patient will follow-up on an annual basis or sooner if she has issues arise.   Current Outpatient Medications on File Prior to Visit  Medication Sig Dispense Refill   ALPRAZolam  (XANAX ) 0.25 MG tablet Take 1 tablet (0.25 mg total) by mouth at bedtime as needed for anxiety. 20 tablet 0   Ascorbic Acid (VITA-C PO) Take by  mouth.     dicyclomine (  BENTYL) 10 MG/5ML solution Take 10 mg by mouth 4 (four) times daily -  before meals and at bedtime.     ELIQUIS  5 MG TABS tablet TAKE 1 TABLET(5 MG) BY MOUTH TWICE DAILY. START AFTER COMPLETING STARTER PACK 60 tablet 5   hydrochlorothiazide  (HYDRODIURIL ) 12.5 MG tablet Take 1 tablet (12.5 mg total) by mouth daily. 90 tablet 1   ivermectin (STROMECTOL) 3 MG TABS tablet Take 5 tablets by mouth daily. Pt taking as needed     Multiple Vitamins-Minerals (ZINC PO) Take by mouth.     ondansetron  (ZOFRAN ) 8 MG tablet Take 4 mg by mouth 2 (two) times daily as needed.     Vitamin D, Ergocalciferol, (DRISDOL) 1.25 MG (50000 UNIT) CAPS capsule Take 50,000 Units by mouth every 7 (seven) days.     No current facility-administered medications on file prior to visit.    There are no Patient Instructions on file for this visit. No follow-ups on file.   Tavaris Eudy E Aeriana Speece, NP

## 2024-01-18 ENCOUNTER — Ambulatory Visit: Payer: Self-pay | Admitting: Family Medicine

## 2024-01-18 ENCOUNTER — Encounter: Payer: Self-pay | Admitting: Family Medicine

## 2024-01-18 VITALS — BP 112/68 | HR 94 | Resp 16 | Ht 68.0 in | Wt 206.0 lb

## 2024-01-18 DIAGNOSIS — I82411 Acute embolism and thrombosis of right femoral vein: Secondary | ICD-10-CM | POA: Diagnosis not present

## 2024-01-18 NOTE — Progress Notes (Signed)
 Date:  01/18/2024   Name:  Sylvia Boyer   DOB:  [birth year]   MRN:  [id number]   Chief Complaint: Anxiety and Med Management - DVT  Patient is a 63year old female who presents for a lower extremity swelling exam with background of deep venous thrombosis.  Patient was placed on Eliquis and is currently completed therapy.. The patient reports the following problems: Currently has no swelling of the legs and Eliquis has been discontinued.[facility] maintenance has been reviewed up-to-date.      Lab Results  Component Value Date   NA 144 08/04/2023   K 4.0 08/04/2023   CO2 26 08/04/2023   GLUCOSE 119 (H) 08/04/2023   BUN 12 08/04/2023   CREATININE 0.78 08/04/2023   CALCIUM 9.8 08/04/2023   EGFR 85 08/04/2023   GFRNONAA >60 11/18/2022   Lab Results  Component Value Date   CHOL 163 02/27/2015   HDL 63 02/27/2015   LDLCALC 87 02/27/2015   TRIG 64 02/27/2015   Lab Results  Component Value Date   TSH 2.03 02/27/2015   No results found for: "HGBA1C" Lab Results  Component Value Date   WBC 10.4 11/18/2022   HGB 13.2 11/18/2022   HCT 38.3 11/18/2022   MCV 88.0 11/18/2022   PLT 257 11/18/2022   Lab Results  Component Value Date   ALT 21 11/17/2022   AST 22 11/17/2022   ALKPHOS 62 11/17/2022   BILITOT 0.8 11/17/2022   No results found for: "25OHVITD2", "25OHVITD3", "VD25OH"   Review of Systems  HENT:  Negative for congestion.   Respiratory:  Negative for apnea, cough, choking, chest tightness, shortness of breath, wheezing and stridor.   Cardiovascular:  Negative for chest pain, palpitations and leg swelling.  Endocrine: Negative for polydipsia and polyuria.  Genitourinary:  Negative for difficulty urinating.    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  Current Meds  Medication Sig   ALPRAZolam (XANAX) 0.25 MG tablet Take 1 tablet (0.25 mg total) by mouth at bedtime as needed for anxiety.   Ascorbic Acid (VITA-C PO) Take by mouth.   aspirin EC 81 MG tablet Take 81 mg by mouth daily. Swallow whole.   dicyclomine (BENTYL) 10 MG/5ML solution Take 10 mg by mouth 4 (four) times daily -  before meals and at bedtime.   ivermectin (STROMECTOL) 3 MG TABS tablet Take 5 tablets by mouth daily. Pt taking as needed   Multiple Vitamins-Minerals (ZINC PO) Take by mouth.   ondansetron (ZOFRAN) 8 MG tablet Take 4 mg by mouth 2 (two) times daily as needed.   Vitamin D, Ergocalciferol, (DRISDOL) 1.25 MG (50000 UNIT) CAPS capsule Take 50,000 Units by mouth every 7 (seven) days.   [  DISCONTINUED] hydrochlorothiazide (HYDRODIURIL) 12.5 MG tablet Take 1 tablet (12.5 mg total) by mouth daily.       01/18/2024    1:24 PM 01/23/2023    2:43 PM 11/28/2022    2:49 PM 02/22/2021   10:38 AM  GAD 7 : Generalized Anxiety Score  Nervous, Anxious, on Edge 0 0 0 0  Control/stop worrying 0 0 0 0  Worry too much - different things 0 0 0 0  Trouble relaxing 0 0 0 0  Restless 0 0 0 0  Easily annoyed or irritable 0 0 0 0  Afraid - awful might happen 0 0 0 0  Total GAD 7 Score 0 0 0 0  Anxiety Difficulty Not difficult at all Not difficult at all Not difficult at all        01/18/2024    1:24 PM 01/23/2023    2:43 PM 11/28/2022    2:48 PM  Depression screen PHQ 2/9   Decreased Interest 0 0 0  Down, Depressed, Hopeless 0 0 0  PHQ - 2 Score 0 0 0  Altered sleeping  0 0  Tired, decreased energy  0 0  Change in appetite  0 0  Feeling bad or failure about yourself   0 0  Trouble concentrating  0 0  Moving slowly or fidgety/restless  0 0  Suicidal thoughts  0 0  PHQ-9 Score  0 0  Difficult doing work/chores  Not difficult at all Not difficult at all    BP Readings from Last 3 Encounters:  01/18/24 112/68  11/13/23 113/76  08/04/23 118/74    Physical Exam Vitals and nursing note reviewed.  Constitutional:      General: She is not in acute distress.    Appearance: She is not diaphoretic.  HENT:     Head: Normocephalic and atraumatic.     Right Ear: Tympanic membrane, ear canal and external ear normal.     Left Ear: Tympanic membrane, ear canal and external ear normal.     Nose: Nose normal. No congestion or rhinorrhea.     Mouth/Throat:     Mouth: Mucous membranes are moist.  Eyes:     General:        Right eye: No discharge.        Left eye: No discharge.     Conjunctiva/sclera: Conjunctivae normal.     Pupils: Pupils are equal, round, and reactive to light.  Neck:     Thyroid: No thyromegaly.     Vascular: No JVD.  Cardiovascular:     Rate and Rhythm: Normal rate and regular rhythm.     Heart sounds: Normal heart sounds. No murmur heard.    No friction rub. No gallop.  Pulmonary:     Effort: Pulmonary effort is normal.     Breath sounds: Normal breath sounds. No wheezing, rhonchi or rales.  Abdominal:     General: Bowel sounds are normal.     Palpations: Abdomen is soft. There is no mass.     Tenderness: There is no abdominal tenderness. There is no guarding.  Musculoskeletal:        General: Normal range of motion.     Cervical back: Normal range of motion and neck supple.  Lymphadenopathy:     Cervical: No cervical adenopathy.  Skin:    General: Skin is warm and dry.  Neurological:     Mental Status: She is alert.      Deep Tendon Reflexes: Reflexes are normal  and symmetric.     Wt Readings from Last 3 Encounters:  01/18/24 206 lb (93.4 kg)  11/13/23 206 lb 12.8 oz (93.8 kg)  08/04/23 212 lb (96.2 kg)    BP 112/68   Pulse 94   Resp 16   Ht 5\' 8"  (1.727 m)   Wt 206 lb (93.4 kg)   SpO2 97%   BMI 31.32 kg/m   Assessment and Plan: 1. DVT of deep femoral vein, right (HCC) (Primary) Development of a deep venous thrombosis acutely secondary to patient having made herself immobile by placing a dog on her lap for several days and caring for the dog with legs in dependent position and very little walking activity.  This led to the eventual swelling and development of a deep venous thrombosis particularly of the right calf.  Ultrasound noted that patient venous thrombosis and she was treated with Eliquis.  Since then the Eliquis has been discontinued.  She also has swelling for which her gynecologist placed her on hydrochlorothiazide.  This was not for blood pressure concerns as her blood pressure has always been in reasonable range and today is at 112/68.  We will discontinue the hydrochlorothiazide and that swelling is not an issue at this time.    Alayne Allis, MD

## 2024-02-23 ENCOUNTER — Encounter (INDEPENDENT_AMBULATORY_CARE_PROVIDER_SITE_OTHER): Payer: Self-pay

## 2024-08-22 ENCOUNTER — Encounter: Payer: Self-pay | Admitting: Student

## 2024-08-22 ENCOUNTER — Ambulatory Visit: Admitting: Student

## 2024-08-22 VITALS — BP 116/76 | HR 88 | Ht 68.0 in | Wt 199.0 lb

## 2024-08-22 DIAGNOSIS — I10 Essential (primary) hypertension: Secondary | ICD-10-CM | POA: Diagnosis not present

## 2024-08-22 DIAGNOSIS — Z86718 Personal history of other venous thrombosis and embolism: Secondary | ICD-10-CM | POA: Diagnosis not present

## 2024-08-22 DIAGNOSIS — D122 Benign neoplasm of ascending colon: Secondary | ICD-10-CM | POA: Diagnosis not present

## 2024-08-22 DIAGNOSIS — F419 Anxiety disorder, unspecified: Secondary | ICD-10-CM

## 2024-08-22 NOTE — Assessment & Plan Note (Signed)
 Has xanax  as needed for anxiety for flights or trips or unfamiliar social situations, estimates using 1 tab once a month or less. Prescribed by Dr. Nonda.

## 2024-08-22 NOTE — Progress Notes (Signed)
 Established Patient Office Visit  Subjective   Patient ID: Sylvia Boyer, female    DOB: [birth year]  Age: 63 y.o. MRN: [id number]  Chief Complaint  Patient presents with   Establish Care    Sylvia Boyer is a 63 y.o. person with medical hx listed below who presents today for transfer of care today. No acute complaints today.   Walking 2 miles, travels to [place]  for 6 months out of the year.   Patient Active Problem List   Diagnosis Date Noted   Hypertension 08/22/2024   History of DVT (deep vein thrombosis) 11/17/2022   Adenomatous polyp of ascending colon 01/27/2017   Anxiety 02/28/2015      ROS Refer to HPI    Objective:     Outpatient Encounter Medications as of 08/22/2024  Medication Sig   ALPRAZolam  (XANAX ) 0.25 MG tablet Take 1 tablet (0.25 mg total) by mouth at bedtime as needed for anxiety.   Ascorbic Acid (VITA-C PO) Take by mouth.   aspirin EC 81 MG tablet Take 81 mg by mouth daily. Swallow whole.   dicyclomine (BENTYL) 10 MG/5ML solution Take 10 mg by mouth 4 (four) times daily -  before meals and at bedtime.   Multiple Vitamins-Minerals (ZINC PO) Take by mouth.   ondansetron  (ZOFRAN ) 8 MG tablet Take 4 mg by mouth 2 (two) times daily as needed.   Vitamin D, Ergocalciferol, (DRISDOL) 1.25 MG (50000 UNIT) CAPS capsule Take 50,000 Units by mouth every 7 (seven) days. (Patient not taking: Reported on 08/22/2024)   [DISCONTINUED] ivermectin (STROMECTOL) 3 MG TABS tablet Take 5 tablets by mouth daily. Pt taking as needed (Patient not taking: Reported on 08/22/2024)   No facility-administered encounter medications on file as of 08/22/2024.    BP 116/76   Pulse 88   Ht 5' 8 (1.727 m)   Wt 199 lb (90.3 kg)   SpO2 98%   BMI 30.26 kg/m  BP Readings from Last 3 Encounters:  08/22/24 116/76  01/18/24 112/68  11/13/23 113/76    Physical Exam Constitutional:      Appearance: Normal appearance.  HENT:     Head: Normocephalic and atraumatic.   Eyes:     Extraocular Movements: Extraocular movements intact.     Pupils: Pupils are equal, round, and reactive to light.  Cardiovascular:     Rate and Rhythm: Normal rate and regular rhythm.     Pulses: Normal pulses.     Heart sounds: No murmur heard. Pulmonary:     Effort: Pulmonary effort is normal.     Breath sounds: No rhonchi or rales.  Abdominal:     General: Abdomen is flat. Bowel sounds are normal. There is no distension.     Palpations: Abdomen is soft.     Tenderness: There is no abdominal tenderness.  Musculoskeletal:        General: Normal range of motion.     Right lower leg: No edema.     Left lower leg: No edema.  Skin:    General: Skin is warm and dry.     Capillary Refill: Capillary refill takes less than 2 seconds.  Neurological:     General: No focal deficit present.     Mental Status: She is alert and oriented to person, place, and time.  Psychiatric:        Mood and Affect: Mood normal.        Behavior: Behavior normal.        08/22/2024  9:28 AM 01/18/2024    1:24 PM 01/23/2023    2:43 PM  Depression screen PHQ 2/9  Decreased Interest 0 0 0  Down, Depressed, Hopeless 0 0 0  PHQ - 2 Score 0 0 0  Altered sleeping 0  0  Tired, decreased energy 0  0  Change in appetite 0  0  Feeling bad or failure about yourself  0  0  Trouble concentrating 0  0  Moving slowly or fidgety/restless 0  0  Suicidal thoughts 0  0  PHQ-9 Score 0  0   Difficult doing work/chores Not difficult at all  Not difficult at all     Data saved with a previous flowsheet row definition       08/22/2024    9:29 AM 01/18/2024    1:24 PM 01/23/2023    2:43 PM 11/28/2022    2:49 PM  GAD 7 : Generalized Anxiety Score  Nervous, Anxious, on Edge 0 0 0 0  Control/stop worrying 0 0 0 0  Worry too much - different things 0 0 0 0  Trouble relaxing 0 0 0 0  Restless 0 0 0 0  Easily annoyed or irritable 1 0 0 0  Afraid - awful might happen 0 0 0 0  Total GAD 7 Score 1 0 0 0   Anxiety Difficulty Not difficult at all Not difficult at all Not difficult at all Not difficult at all    No results found for any visits on 08/22/24.  Last CBC Lab Results  Component Value Date   WBC 10.4 11/18/2022   HGB 13.2 11/18/2022   HCT 38.3 11/18/2022   MCV 88.0 11/18/2022   MCH 30.3 11/18/2022   RDW 12.3 11/18/2022   PLT 257 11/18/2022   Last metabolic panel Lab Results  Component Value Date   GLUCOSE 119 (H) 08/04/2023   NA 144 08/04/2023   K 4.0 08/04/2023   CL 104 08/04/2023   CO2 26 08/04/2023   BUN 12 08/04/2023   CREATININE 0.78 08/04/2023   EGFR 85 08/04/2023   CALCIUM 9.8 08/04/2023   PHOS 2.6 (L) 08/04/2023   PROT 7.5 11/17/2022   ALBUMIN 4.3 08/04/2023   BILITOT 0.8 11/17/2022   ALKPHOS 62 11/17/2022   AST 22 11/17/2022   ALT 21 11/17/2022   ANIONGAP 8 11/18/2022   Last lipids Lab Results  Component Value Date   CHOL 163 02/27/2015   HDL 63 02/27/2015   LDLCALC 87 02/27/2015   TRIG 64 02/27/2015   The ASCVD Risk score (Arnett DK, et al., 2019) failed to calculate for the following reasons:   Cannot find a previous HDL lab   Cannot find a previous total cholesterol lab    Assessment & Plan:  Anxiety Assessment & Plan: Has xanax  as needed for anxiety for flights or trips or unfamiliar social situations, estimates using 1 tab once a month or less. Prescribed by Dr. Nonda.   Primary hypertension Assessment & Plan: Normotensive today. Reports she stopped hydrochlorothiazide  about 1 year, because she felt she no longer needed it. Does not check BP at home. Denies CP, dyspnea, weakness, dizziness, or sensory changes. Continue to monitor off medication.    Adenomatous polyp of ascending colon Assessment & Plan: Last colon 01/2017 with 1 TA of the ascending colon, reccommended follow up 5 years. Denies hematochezia, melena, or change in stool caliber. Referral made to GI.   Orders: -     Ambulatory referral to  Gastroenterology  History  of DVT (deep vein thrombosis) Assessment & Plan: Currently on ASA 81 mg daily, hx of provoked DVT in 03/2023 completed apixiban. Repeat DVT study n 10/06/2023  negative for DVT. Continue with compression, exercise, elevation, and activity when she is travelling. She has follow up with vascular in 11/2024.       Return in about 5 months (around 01/20/2025) for physical.    Harlene Saddler, MD

## 2024-08-22 NOTE — Assessment & Plan Note (Signed)
 Last colon 01/2017 with 1 TA of the ascending colon, reccommended follow up 5 years. Denies hematochezia, melena, or change in stool caliber. Referral made to GI.

## 2024-08-22 NOTE — Assessment & Plan Note (Addendum)
 Normotensive today. Reports she stopped hydrochlorothiazide  about 1 year, because she felt she no longer needed it. Does not check BP at home. Denies CP, dyspnea, weakness, dizziness, or sensory changes. Continue to monitor off medication.

## 2024-08-22 NOTE — Assessment & Plan Note (Addendum)
 Currently on ASA 81 mg daily, hx of provoked DVT in 03/2023 completed apixiban. Repeat DVT study n 10/06/2023  negative for DVT. Continue with compression, exercise, elevation, and activity when she is travelling. She has follow up with vascular in 11/2024.

## 2024-08-25 ENCOUNTER — Telehealth: Payer: Self-pay

## 2024-08-25 NOTE — Telephone Encounter (Signed)
 Per pt returned your call to schedule. Pt would like to schedule procedure for Jan.

## 2024-11-11 ENCOUNTER — Ambulatory Visit (INDEPENDENT_AMBULATORY_CARE_PROVIDER_SITE_OTHER): Payer: Medicare PPO | Admitting: Nurse Practitioner

## 2024-11-11 ENCOUNTER — Encounter (INDEPENDENT_AMBULATORY_CARE_PROVIDER_SITE_OTHER): Payer: Self-pay | Admitting: Nurse Practitioner

## 2025-01-24 ENCOUNTER — Encounter: Admitting: Student
